# Patient Record
Sex: Female | Born: 1970 | Race: White | Hispanic: No | Marital: Married | Smoking: Former smoker
Health system: Southern US, Community
[De-identification: ages and names within clinical notes are randomized; demographics above are authoritative.]

## PROBLEM LIST (undated history)

## (undated) DIAGNOSIS — K219 Gastro-esophageal reflux disease without esophagitis: Secondary | ICD-10-CM

## (undated) DIAGNOSIS — E559 Vitamin D deficiency, unspecified: Secondary | ICD-10-CM

## (undated) DIAGNOSIS — K429 Umbilical hernia without obstruction or gangrene: Secondary | ICD-10-CM

## (undated) DIAGNOSIS — R011 Cardiac murmur, unspecified: Secondary | ICD-10-CM

## (undated) DIAGNOSIS — D649 Anemia, unspecified: Secondary | ICD-10-CM

## (undated) DIAGNOSIS — E890 Postprocedural hypothyroidism: Secondary | ICD-10-CM

## (undated) DIAGNOSIS — T7840XA Allergy, unspecified, initial encounter: Secondary | ICD-10-CM

## (undated) HISTORY — DX: Allergy, unspecified, initial encounter: T78.40XA

## (undated) HISTORY — PX: TUBAL LIGATION: SHX77

## (undated) HISTORY — DX: Cardiac murmur, unspecified: R01.1

## (undated) HISTORY — DX: Gastro-esophageal reflux disease without esophagitis: K21.9

## (undated) HISTORY — DX: Vitamin D deficiency, unspecified: E55.9

## (undated) HISTORY — PX: COLONOSCOPY: SHX174

## (undated) HISTORY — DX: Postprocedural hypothyroidism: E89.0

## (undated) HISTORY — DX: Umbilical hernia without obstruction or gangrene: K42.9

## (undated) HISTORY — DX: Anemia, unspecified: D64.9

---

## 2010-12-28 HISTORY — PX: APPENDECTOMY: SHX54

## 2010-12-28 HISTORY — PX: THYROIDECTOMY: SHX17

## 2010-12-28 HISTORY — PX: CHOLECYSTECTOMY: SHX55

## 2015-12-29 LAB — HM PAP SMEAR

## 2016-03-12 ENCOUNTER — Other Ambulatory Visit: Payer: Self-pay | Admitting: Family Medicine

## 2016-03-12 DIAGNOSIS — Z1231 Encounter for screening mammogram for malignant neoplasm of breast: Secondary | ICD-10-CM

## 2016-07-09 ENCOUNTER — Ambulatory Visit
Admission: RE | Admit: 2016-07-09 | Discharge: 2016-07-09 | Disposition: A | Discharge status: Home / Self Care | Payer: BLUE CROSS/BLUE SHIELD | Source: Ambulatory Visit | Attending: Family Medicine | Admitting: Family Medicine

## 2016-07-09 DIAGNOSIS — Z1231 Encounter for screening mammogram for malignant neoplasm of breast: Secondary | ICD-10-CM

## 2016-07-10 ENCOUNTER — Other Ambulatory Visit: Payer: Self-pay | Admitting: Family Medicine

## 2016-07-10 DIAGNOSIS — R928 Other abnormal and inconclusive findings on diagnostic imaging of breast: Secondary | ICD-10-CM

## 2016-07-15 ENCOUNTER — Ambulatory Visit
Admission: RE | Admit: 2016-07-15 | Discharge: 2016-07-15 | Disposition: A | Discharge status: Home / Self Care | Payer: BLUE CROSS/BLUE SHIELD | Source: Ambulatory Visit | Attending: Family Medicine | Admitting: Family Medicine

## 2016-07-15 DIAGNOSIS — N63 Unspecified lump in breast: Secondary | ICD-10-CM | POA: Diagnosis not present

## 2016-07-15 DIAGNOSIS — R928 Other abnormal and inconclusive findings on diagnostic imaging of breast: Secondary | ICD-10-CM

## 2016-08-06 DIAGNOSIS — J029 Acute pharyngitis, unspecified: Secondary | ICD-10-CM | POA: Diagnosis not present

## 2016-08-20 DIAGNOSIS — K429 Umbilical hernia without obstruction or gangrene: Secondary | ICD-10-CM | POA: Diagnosis not present

## 2016-08-20 DIAGNOSIS — J020 Streptococcal pharyngitis: Secondary | ICD-10-CM | POA: Diagnosis not present

## 2016-08-20 DIAGNOSIS — E663 Overweight: Secondary | ICD-10-CM | POA: Diagnosis not present

## 2016-08-20 DIAGNOSIS — J029 Acute pharyngitis, unspecified: Secondary | ICD-10-CM | POA: Diagnosis not present

## 2016-09-04 DIAGNOSIS — Z01419 Encounter for gynecological examination (general) (routine) without abnormal findings: Secondary | ICD-10-CM | POA: Diagnosis not present

## 2016-09-04 DIAGNOSIS — D259 Leiomyoma of uterus, unspecified: Secondary | ICD-10-CM | POA: Diagnosis not present

## 2016-09-04 DIAGNOSIS — R102 Pelvic and perineal pain: Secondary | ICD-10-CM | POA: Diagnosis not present

## 2016-09-10 DIAGNOSIS — R102 Pelvic and perineal pain: Secondary | ICD-10-CM | POA: Diagnosis not present

## 2016-09-10 DIAGNOSIS — D259 Leiomyoma of uterus, unspecified: Secondary | ICD-10-CM | POA: Diagnosis not present

## 2016-12-23 DIAGNOSIS — Z6827 Body mass index (BMI) 27.0-27.9, adult: Secondary | ICD-10-CM | POA: Diagnosis not present

## 2016-12-23 DIAGNOSIS — J019 Acute sinusitis, unspecified: Secondary | ICD-10-CM | POA: Diagnosis not present

## 2017-03-10 DIAGNOSIS — E890 Postprocedural hypothyroidism: Secondary | ICD-10-CM | POA: Diagnosis not present

## 2017-03-10 DIAGNOSIS — Z0000 Encounter for general adult medical examination without abnormal findings: Secondary | ICD-10-CM | POA: Diagnosis not present

## 2017-03-10 DIAGNOSIS — E559 Vitamin D deficiency, unspecified: Secondary | ICD-10-CM | POA: Diagnosis not present

## 2017-03-10 DIAGNOSIS — Z6827 Body mass index (BMI) 27.0-27.9, adult: Secondary | ICD-10-CM | POA: Diagnosis not present

## 2017-03-22 DIAGNOSIS — J0100 Acute maxillary sinusitis, unspecified: Secondary | ICD-10-CM | POA: Diagnosis not present

## 2017-04-22 DIAGNOSIS — Z1211 Encounter for screening for malignant neoplasm of colon: Secondary | ICD-10-CM | POA: Diagnosis not present

## 2017-04-22 DIAGNOSIS — Z01818 Encounter for other preprocedural examination: Secondary | ICD-10-CM | POA: Diagnosis not present

## 2017-04-22 DIAGNOSIS — Z800 Family history of malignant neoplasm of digestive organs: Secondary | ICD-10-CM | POA: Diagnosis not present

## 2017-06-02 DIAGNOSIS — Z800 Family history of malignant neoplasm of digestive organs: Secondary | ICD-10-CM | POA: Diagnosis not present

## 2017-06-02 DIAGNOSIS — E039 Hypothyroidism, unspecified: Secondary | ICD-10-CM | POA: Diagnosis not present

## 2017-06-02 DIAGNOSIS — Z1211 Encounter for screening for malignant neoplasm of colon: Secondary | ICD-10-CM | POA: Diagnosis not present

## 2017-06-02 DIAGNOSIS — K5730 Diverticulosis of large intestine without perforation or abscess without bleeding: Secondary | ICD-10-CM | POA: Diagnosis not present

## 2017-06-02 DIAGNOSIS — E559 Vitamin D deficiency, unspecified: Secondary | ICD-10-CM | POA: Diagnosis not present

## 2017-06-02 DIAGNOSIS — K648 Other hemorrhoids: Secondary | ICD-10-CM | POA: Diagnosis not present

## 2017-06-02 DIAGNOSIS — Z87891 Personal history of nicotine dependence: Secondary | ICD-10-CM | POA: Diagnosis not present

## 2017-06-02 DIAGNOSIS — K219 Gastro-esophageal reflux disease without esophagitis: Secondary | ICD-10-CM | POA: Diagnosis not present

## 2017-06-02 DIAGNOSIS — Z9049 Acquired absence of other specified parts of digestive tract: Secondary | ICD-10-CM | POA: Diagnosis not present

## 2017-06-02 LAB — HM COLONOSCOPY

## 2017-08-03 DIAGNOSIS — J029 Acute pharyngitis, unspecified: Secondary | ICD-10-CM | POA: Diagnosis not present

## 2017-08-03 DIAGNOSIS — J0100 Acute maxillary sinusitis, unspecified: Secondary | ICD-10-CM | POA: Diagnosis not present

## 2017-09-30 ENCOUNTER — Other Ambulatory Visit: Payer: Self-pay | Admitting: Medical

## 2017-09-30 DIAGNOSIS — K219 Gastro-esophageal reflux disease without esophagitis: Secondary | ICD-10-CM | POA: Diagnosis not present

## 2017-09-30 DIAGNOSIS — Z1231 Encounter for screening mammogram for malignant neoplasm of breast: Secondary | ICD-10-CM

## 2017-09-30 DIAGNOSIS — Z23 Encounter for immunization: Secondary | ICD-10-CM | POA: Diagnosis not present

## 2017-09-30 DIAGNOSIS — E034 Atrophy of thyroid (acquired): Secondary | ICD-10-CM | POA: Diagnosis not present

## 2017-09-30 DIAGNOSIS — E890 Postprocedural hypothyroidism: Secondary | ICD-10-CM | POA: Diagnosis not present

## 2017-10-13 ENCOUNTER — Ambulatory Visit
Admission: RE | Admit: 2017-10-13 | Discharge: 2017-10-13 | Disposition: A | Discharge status: Home / Self Care | Payer: BLUE CROSS/BLUE SHIELD | Source: Ambulatory Visit | Attending: Medical | Admitting: Medical

## 2017-10-13 DIAGNOSIS — Z1231 Encounter for screening mammogram for malignant neoplasm of breast: Secondary | ICD-10-CM

## 2018-03-31 DIAGNOSIS — Z0000 Encounter for general adult medical examination without abnormal findings: Secondary | ICD-10-CM | POA: Diagnosis not present

## 2018-03-31 DIAGNOSIS — Z6827 Body mass index (BMI) 27.0-27.9, adult: Secondary | ICD-10-CM | POA: Diagnosis not present

## 2018-03-31 DIAGNOSIS — J018 Other acute sinusitis: Secondary | ICD-10-CM | POA: Diagnosis not present

## 2018-09-30 DIAGNOSIS — Z23 Encounter for immunization: Secondary | ICD-10-CM | POA: Diagnosis not present

## 2018-09-30 DIAGNOSIS — E890 Postprocedural hypothyroidism: Secondary | ICD-10-CM | POA: Diagnosis not present

## 2018-09-30 DIAGNOSIS — K219 Gastro-esophageal reflux disease without esophagitis: Secondary | ICD-10-CM | POA: Diagnosis not present

## 2018-09-30 DIAGNOSIS — E559 Vitamin D deficiency, unspecified: Secondary | ICD-10-CM | POA: Diagnosis not present

## 2018-09-30 DIAGNOSIS — F5102 Adjustment insomnia: Secondary | ICD-10-CM | POA: Diagnosis not present

## 2019-01-31 ENCOUNTER — Other Ambulatory Visit: Payer: Self-pay | Admitting: Medical

## 2019-01-31 DIAGNOSIS — Z1231 Encounter for screening mammogram for malignant neoplasm of breast: Secondary | ICD-10-CM

## 2019-03-01 ENCOUNTER — Ambulatory Visit
Admission: RE | Admit: 2019-03-01 | Discharge: 2019-03-01 | Disposition: A | Discharge status: Home / Self Care | Payer: BLUE CROSS/BLUE SHIELD | Source: Ambulatory Visit | Attending: Medical | Admitting: Medical

## 2019-03-01 DIAGNOSIS — Z1231 Encounter for screening mammogram for malignant neoplasm of breast: Secondary | ICD-10-CM

## 2019-03-01 LAB — HM MAMMOGRAPHY: HM Mammogram: NORMAL (ref 0–4)

## 2019-05-25 DIAGNOSIS — Z6823 Body mass index (BMI) 23.0-23.9, adult: Secondary | ICD-10-CM | POA: Diagnosis not present

## 2019-05-25 DIAGNOSIS — Z0000 Encounter for general adult medical examination without abnormal findings: Secondary | ICD-10-CM | POA: Diagnosis not present

## 2019-07-27 DIAGNOSIS — E890 Postprocedural hypothyroidism: Secondary | ICD-10-CM | POA: Diagnosis not present

## 2019-08-01 DIAGNOSIS — J301 Allergic rhinitis due to pollen: Secondary | ICD-10-CM | POA: Diagnosis not present

## 2020-01-04 DIAGNOSIS — J018 Other acute sinusitis: Secondary | ICD-10-CM | POA: Diagnosis not present

## 2020-01-23 DIAGNOSIS — Z20828 Contact with and (suspected) exposure to other viral communicable diseases: Secondary | ICD-10-CM | POA: Diagnosis not present

## 2020-01-23 DIAGNOSIS — R0981 Nasal congestion: Secondary | ICD-10-CM | POA: Diagnosis not present

## 2020-02-07 ENCOUNTER — Other Ambulatory Visit: Payer: Self-pay

## 2020-02-07 ENCOUNTER — Encounter: Payer: Self-pay | Admitting: Family Medicine

## 2020-02-07 ENCOUNTER — Ambulatory Visit (INDEPENDENT_AMBULATORY_CARE_PROVIDER_SITE_OTHER): Payer: BLUE CROSS/BLUE SHIELD | Admitting: Family Medicine

## 2020-02-07 VITALS — BP 136/88 | HR 111 | Temp 96.7°F | Ht 65.0 in | Wt 168.8 lb

## 2020-02-07 DIAGNOSIS — K439 Ventral hernia without obstruction or gangrene: Secondary | ICD-10-CM

## 2020-02-07 DIAGNOSIS — E038 Other specified hypothyroidism: Secondary | ICD-10-CM | POA: Diagnosis not present

## 2020-02-07 DIAGNOSIS — E559 Vitamin D deficiency, unspecified: Secondary | ICD-10-CM

## 2020-02-07 DIAGNOSIS — Z1231 Encounter for screening mammogram for malignant neoplasm of breast: Secondary | ICD-10-CM | POA: Diagnosis not present

## 2020-02-07 DIAGNOSIS — Z0000 Encounter for general adult medical examination without abnormal findings: Secondary | ICD-10-CM

## 2020-02-07 NOTE — Patient Instructions (Signed)
Health Maintenance, Female Adopting a healthy lifestyle and getting preventive care are important in promoting health and wellness. Ask your health care provider about:  The right schedule for you to have regular tests and exams.  Things you can do on your own to prevent diseases and keep yourself healthy. What should I know about diet, weight, and exercise? Eat a healthy diet   Eat a diet that includes plenty of vegetables, fruits, low-fat dairy products, and lean protein.  Do not eat a lot of foods that are high in solid fats, added sugars, or sodium. Maintain a healthy weight Body mass index (BMI) is used to identify weight problems. It estimates body fat based on height and weight. Your health care provider can help determine your BMI and help you achieve or maintain a healthy weight. Get regular exercise Get regular exercise. This is one of the most important things you can do for your health. Most adults should:  Exercise for at least 150 minutes each week. The exercise should increase your heart rate and make you sweat (moderate-intensity exercise).  Do strengthening exercises at least twice a week. This is in addition to the moderate-intensity exercise.  Spend less time sitting. Even light physical activity can be beneficial. Watch cholesterol and blood lipids Have your blood tested for lipids and cholesterol at 49 years of age, then have this test every 5 years. Have your cholesterol levels checked more often if:  Your lipid or cholesterol levels are high.  You are older than 49 years of age.  You are at high risk for heart disease. What should I know about cancer screening? Depending on your health history and family history, you may need to have cancer screening at various ages. This may include screening for:  Breast cancer.  Cervical cancer.  Colorectal cancer.  Skin cancer.  Lung cancer. What should I know about heart disease, diabetes, and high blood  pressure? Blood pressure and heart disease  High blood pressure causes heart disease and increases the risk of stroke. This is more likely to develop in people who have high blood pressure readings, are of African descent, or are overweight.  Have your blood pressure checked: ? Every 3-5 years if you are 18-39 years of age. ? Every year if you are 40 years old or older. Diabetes Have regular diabetes screenings. This checks your fasting blood sugar level. Have the screening done:  Once every three years after age 40 if you are at a normal weight and have a low risk for diabetes.  More often and at a younger age if you are overweight or have a high risk for diabetes. What should I know about preventing infection? Hepatitis B If you have a higher risk for hepatitis B, you should be screened for this virus. Talk with your health care provider to find out if you are at risk for hepatitis B infection. Hepatitis C Testing is recommended for:  Everyone born from 1945 through 1965.  Anyone with known risk factors for hepatitis C. Sexually transmitted infections (STIs)  Get screened for STIs, including gonorrhea and chlamydia, if: ? You are sexually active and are younger than 49 years of age. ? You are older than 49 years of age and your health care provider tells you that you are at risk for this type of infection. ? Your sexual activity has changed since you were last screened, and you are at increased risk for chlamydia or gonorrhea. Ask your health care provider if   you are at risk.  Ask your health care provider about whether you are at high risk for HIV. Your health care provider may recommend a prescription medicine to help prevent HIV infection. If you choose to take medicine to prevent HIV, you should first get tested for HIV. You should then be tested every 3 months for as long as you are taking the medicine. Pregnancy  If you are about to stop having your period (premenopausal) and  you may become pregnant, seek counseling before you get pregnant.  Take 400 to 800 micrograms (mcg) of folic acid every day if you become pregnant.  Ask for birth control (contraception) if you want to prevent pregnancy. Osteoporosis and menopause Osteoporosis is a disease in which the bones lose minerals and strength with aging. This can result in bone fractures. If you are 65 years old or older, or if you are at risk for osteoporosis and fractures, ask your health care provider if you should:  Be screened for bone loss.  Take a calcium or vitamin D supplement to lower your risk of fractures.  Be given hormone replacement therapy (HRT) to treat symptoms of menopause. Follow these instructions at home: Lifestyle  Do not use any products that contain nicotine or tobacco, such as cigarettes, e-cigarettes, and chewing tobacco. If you need help quitting, ask your health care provider.  Do not use street drugs.  Do not share needles.  Ask your health care provider for help if you need support or information about quitting drugs. Alcohol use  Do not drink alcohol if: ? Your health care provider tells you not to drink. ? You are pregnant, may be pregnant, or are planning to become pregnant.  If you drink alcohol: ? Limit how much you use to 0-1 drink a day. ? Limit intake if you are breastfeeding.  Be aware of how much alcohol is in your drink. In the [place] one drink equals one 12 oz bottle of beer (355 mL), one 5 oz glass of wine (148 mL), or one 1 oz glass of hard liquor (44 mL). General instructions  Schedule regular health, dental, and eye exams.  Stay current with your vaccines.  Tell your health care provider if: ? You often feel depressed. ? You have ever been abused or do not feel safe at home. Summary  Adopting a healthy lifestyle and getting preventive care are important in promoting health and wellness.  Follow your health care provider's instructions about healthy  diet, exercising, and getting tested or screened for diseases.  Follow your health care provider's instructions on monitoring your cholesterol and blood pressure. This information is not intended to replace advice given to you by your health care provider. Make sure you discuss any questions you have with your health care provider. Document Revised: 12/07/2018 Document Reviewed: 12/07/2018 Elsevier Patient Education  2020 Elsevier Inc.  

## 2020-02-08 LAB — COMP. METABOLIC PANEL (12)
AST: 25 IU/L (ref 0–40)
Albumin/Globulin Ratio: 2 (ref 1.2–2.2)
Albumin: 4.5 g/dL (ref 3.8–4.8)
Alkaline Phosphatase: 66 IU/L (ref 39–117)
BUN/Creatinine Ratio: 12 (ref 9–23)
BUN: 10 mg/dL (ref 6–24)
Bilirubin Total: 0.4 mg/dL (ref 0.0–1.2)
Calcium: 9.4 mg/dL (ref 8.7–10.2)
Chloride: 103 mmol/L (ref 96–106)
Creatinine, Ser: 0.81 mg/dL (ref 0.57–1.00)
GFR calc Af Amer: 99 mL/min/{1.73_m2} (ref >59)
GFR calc non Af Amer: 86 mL/min/{1.73_m2} (ref >59)
Globulin, Total: 2.3 g/dL (ref 1.5–4.5)
Glucose: 93 mg/dL (ref 65–99)
Potassium: 4.1 mmol/L (ref 3.5–5.2)
Sodium: 140 mmol/L (ref 134–144)
Total Protein: 6.8 g/dL (ref 6.0–8.5)

## 2020-02-08 LAB — LIPID PANEL
Chol/HDL Ratio: 3.3 ratio (ref 0.0–4.4)
Cholesterol, Total: 176 mg/dL (ref 100–199)
HDL: 53 mg/dL (ref >39)
LDL Chol Calc (NIH): 98 mg/dL (ref 0–99)
Triglycerides: 142 mg/dL (ref 0–149)
VLDL Cholesterol Cal: 25 mg/dL (ref 5–40)

## 2020-02-08 LAB — CBC WITH DIFFERENTIAL/PLATELET
Basophils Absolute: 0 10*3/uL (ref 0.0–0.2)
Basos: 1 %
EOS (ABSOLUTE): 0 10*3/uL (ref 0.0–0.4)
Eos: 0 %
Hematocrit: 40.3 % (ref 34.0–46.6)
Hemoglobin: 13.3 g/dL (ref 11.1–15.9)
Immature Grans (Abs): 0 10*3/uL (ref 0.0–0.1)
Immature Granulocytes: 0 %
Lymphocytes Absolute: 1.7 10*3/uL (ref 0.7–3.1)
Lymphs: 23 %
MCH: 28.6 pg (ref 26.6–33.0)
MCHC: 33 g/dL (ref 31.5–35.7)
MCV: 87 fL (ref 79–97)
Monocytes Absolute: 0.7 10*3/uL (ref 0.1–0.9)
Monocytes: 9 %
Neutrophils Absolute: 5.1 10*3/uL (ref 1.4–7.0)
Neutrophils: 67 %
Platelets: 357 10*3/uL (ref 150–450)
RBC: 4.65 x10E6/uL (ref 3.77–5.28)
RDW: 13.7 % (ref 11.7–15.4)
WBC: 7.6 10*3/uL (ref 3.4–10.8)

## 2020-02-08 LAB — TSH: TSH: 0.31 u[IU]/mL — ABNORMAL LOW (ref 0.450–4.500)

## 2020-02-08 LAB — CARDIOVASCULAR RISK ASSESSMENT

## 2020-02-08 LAB — VITAMIN D 25 HYDROXY (VIT D DEFICIENCY, FRACTURES): Vit D, 25-Hydroxy: 43.9 ng/mL (ref 30.0–100.0)

## 2020-02-09 ENCOUNTER — Other Ambulatory Visit: Payer: Self-pay

## 2020-02-09 DIAGNOSIS — E038 Other specified hypothyroidism: Secondary | ICD-10-CM

## 2020-02-12 ENCOUNTER — Other Ambulatory Visit: Payer: Self-pay

## 2020-02-12 MED ORDER — FUROSEMIDE 20 MG PO TABS: 20.0000 mg | ORAL_TABLET | Freq: Every day | ORAL | 2 refills | Status: DC | PRN

## 2020-02-12 MED ORDER — THYROID 90 MG PO TABS: 90.0000 mg | ORAL_TABLET | Freq: Every day | ORAL | 0 refills | Status: DC

## 2020-02-14 ENCOUNTER — Other Ambulatory Visit: Payer: Self-pay | Admitting: Family Medicine

## 2020-02-15 ENCOUNTER — Other Ambulatory Visit: Payer: Self-pay

## 2020-02-15 MED ORDER — THYROID 90 MG PO TABS: 90.0000 mg | ORAL_TABLET | Freq: Every day | ORAL | 0 refills | Status: DC

## 2020-02-19 ENCOUNTER — Encounter: Payer: Self-pay | Admitting: Family Medicine

## 2020-02-19 NOTE — Progress Notes (Signed)
Subjective:  Patient ID: Sherry Carpenter, female    DOB: Apr 28, 1971  Age: 49 y.o. MRN: [id number]  Chief Complaint  Patient presents with  . Annual Exam  . Gastroesophageal Reflux  . Hypothyroidism    HPI Encounter for general adult medical examination without abnormal findings  Physical ("At Risk" items are starred): Patient's last physical exam was 1 year ago .  Weight: Appropriate for height (BMI less than 27%) ;  Blood Pressure: Normal (BP less than 120/80) ;  Medical History: Patient history reviewed ; Family history reviewed ;  Allergies Reviewed: No change in current allergies ;  Medications Reviewed: Medications reviewed - no changes ;  Lipids: Normal lipid levels ;  Smoking: Life-long non-smoker ;  Physical Activity: Does not exercise ;  Alcohol/Drug Use: Is a non-drinker ; No illicit drug use ;  Patient is not afflicted from Stress Incontinence and Urge Incontinence  Safety: reviewed ; Patient wears a seat belt, has smoke detectors, has carbon monoxide detectors, practices appropriate gun safety, and wears sunscreen with extended sun exposure. Dental Care: biannual cleanings, brushes and flosses daily. Ophthalmology/Optometry: Annual visit.  Hearing loss: none Vision impairments: none Last Mammogram: 03/01/2019. Colonoscopy: 06/02/2017. Pap Smear: 2017 done by gynecologist.   Fall Risk  02/07/2020  Falls in the past year? 0  Number falls in past yr: 0  Injury with Fall? 0     Depression screen PHQ 2/9 02/07/2020  Decreased Interest 0  Down, Depressed, Hopeless 0  PHQ - 2 Score 0     Functional Status Survey: Is the patient deaf or have difficulty hearing?: No Does the patient have difficulty seeing, even when wearing glasses/contacts?: No Does the patient have difficulty concentrating, remembering, or making decisions?: No Does the patient have difficulty walking or climbing stairs?: No Does the patient have difficulty dressing or bathing?: No Does the patient  have difficulty doing errands alone such as visiting a doctor's office or shopping?: No   Social Hx   Social History   Socioeconomic History  . Marital status: Married    Spouse name: Not on file  . Number of children: 2  . Years of education: Not on file  . Highest education level: Not on file  Occupational History  . Occupation: Theatre stage manager. Assistant  Tobacco Use  . Smoking status: Former Smoker    Types: Cigarettes    Quit date: 12/28/2010    Years since quitting: 9.1  . Smokeless tobacco: Never Used  Substance and Sexual Activity  . Alcohol use: Not Currently  . Drug use: Never  . Sexual activity: Not on file  Other Topics Concern  . Not on file  Social History Narrative   wears sunscreen, brushes and flosses daily, see's dentist bi-annually, has smoke detectors, wears a seatbelt and practices gun safety   Social Determinants of Health   Financial Resource Strain:   . Difficulty of Paying Living Expenses: Not on file  Food Insecurity:   . Worried About Programme researcher, broadcasting/film/video in the Last Year: Not on file  . Ran Out of Food in the Last Year: Not on file  Transportation Needs:   . Lack of Transportation (Medical): Not on file  . Lack of Transportation (Non-Medical): Not on file  Physical Activity:   . Days of Exercise per Week: Not on file  . Minutes of Exercise per Session: Not on file  Stress:   . Feeling of Stress : Not on file  Social Connections:   .  Frequency of Communication with Friends and Family: Not on file  . Frequency of Social Gatherings with Friends and Family: Not on file  . Attends Religious Services: Not on file  . Active Member of Clubs or Organizations: Not on file  . Attends Banker Meetings: Not on file  . Marital Status: Not on file   Past Medical History:  Diagnosis Date  . GERD (gastroesophageal reflux disease)   . Postprocedural hypothyroidism   . Umbilical hernia   . Vitamin D deficiency, unspecified    Family History   Problem Relation Age of Onset  . Colon cancer Mother   . Lung cancer Mother   . Hypothyroidism Mother   . Breast cancer Neg Hx     Review of Systems  Constitutional: Positive for fatigue. Negative for chills and fever.  HENT: Positive for congestion. Negative for ear pain and sore throat.   Eyes: Negative for pain.  Respiratory: Negative for cough and shortness of breath.   Cardiovascular: Negative for chest pain.  Gastrointestinal: Negative for abdominal pain, constipation, diarrhea, nausea and vomiting.  Endocrine: Negative for polydipsia, polyphagia and polyuria.  Genitourinary: Positive for menstrual problem. Negative for dysuria and urgency.       Cramps. Known fibroids.    Musculoskeletal: Negative for arthralgias and myalgias.  Neurological: Negative for dizziness and headaches.  Psychiatric/Behavioral: Negative for dysphoric mood. The patient is not nervous/anxious.        Grieving death of coworker.      Objective:  BP 136/88 (BP Location: Right Arm, Patient Position: Sitting)   Pulse (!) 111   Temp (!) 96.7 F (35.9 C) (Temporal)   Ht 5\' 5"  (1.651 m)   Wt 168 lb 12.8 oz (76.6 kg)   LMP 01/24/2020 (Exact Date)   SpO2 97%   BMI 28.09 kg/m   BP/Weight 02/07/2020  Systolic BP 136  Diastolic BP 88  Wt. (Lbs) 168.8  BMI 28.09    Physical Exam Vitals reviewed.  Constitutional:      General: She is not in acute distress.    Appearance: Normal appearance. She is obese.  HENT:     Right Ear: Tympanic membrane and ear canal normal.     Left Ear: Tympanic membrane and ear canal normal.     Nose: Nose normal. No congestion or rhinorrhea.  Eyes:     Conjunctiva/sclera: Conjunctivae normal.  Neck:     Thyroid: No thyroid mass.  Cardiovascular:     Rate and Rhythm: Normal rate and regular rhythm.     Pulses: Normal pulses.     Heart sounds: No murmur.  Pulmonary:     Effort: Pulmonary effort is normal.     Breath sounds: Normal breath sounds.  Abdominal:      General: Bowel sounds are normal.     Palpations: Abdomen is soft. There is no mass.     Tenderness: There is no abdominal tenderness.     Comments: Ventral hernia.   Musculoskeletal:        General: Normal range of motion.  Lymphadenopathy:     Cervical: No cervical adenopathy.  Skin:    General: Skin is warm and dry.  Neurological:     Mental Status: She is alert and oriented to person, place, and time.     Cranial Nerves: No cranial nerve deficit.  Psychiatric:        Mood and Affect: Mood normal.        Behavior: Behavior normal.  Lab Results  Component Value Date   WBC 7.6 02/07/2020   HGB 13.3 02/07/2020   HCT 40.3 02/07/2020   PLT 357 02/07/2020   GLUCOSE 93 02/07/2020   CHOL 176 02/07/2020   TRIG 142 02/07/2020   HDL 53 02/07/2020   LDLCALC 98 02/07/2020   AST 25 02/07/2020   NA 140 02/07/2020   K 4.1 02/07/2020   CL 103 02/07/2020   CREATININE 0.81 02/07/2020   BUN 10 02/07/2020   TSH 0.310 (L) 02/07/2020      Assessment & Plan:   Problem List Items Addressed This Visit      Endocrine   Secondary hypothyroidism     Other   Visit for screening mammogram - Primary   Relevant Orders   MM Digital Screening   Ventral hernia without obstruction or gangrene   Vitamin D insufficiency   Relevant Orders   Vitamin D, 25-hydroxy (Completed)      No problem-specific Assessment & Plan notes found for this encounter.   No orders of the defined types were placed in this encounter.   AN INDIVIDUALIZED CARE PLAN: was established or reinforced today.   The patient's disease status was assessed using clinical findings on exam, labs, and/or other diagnostic testing, such as xrays, to determine his/her success in meeting treatment goals based on disease specific evidence-based guidelines and found to be well controlled.   SELF MANAGEMENT: The patient and I together assessed ways to personally work towards obtaining the recommended goals  Support needs The  patient and/or family needs were assessed and services were offered and not necessary at this time.    Follow-up: Return in about 6 months (around 08/06/2020) for fasting.  A CLINICAL SUMMARY including a written plan identify barriers to care unique to individual due to social or financial issues and help create solutions together. and a patient's and the patient's families understanding of their medical issues and care needs   [place] [phone]

## 2020-03-22 ENCOUNTER — Other Ambulatory Visit: Payer: Self-pay

## 2020-03-22 ENCOUNTER — Other Ambulatory Visit: Payer: Self-pay | Admitting: Family Medicine

## 2020-03-22 ENCOUNTER — Other Ambulatory Visit: Payer: BLUE CROSS/BLUE SHIELD

## 2020-03-22 DIAGNOSIS — E038 Other specified hypothyroidism: Secondary | ICD-10-CM | POA: Diagnosis not present

## 2020-03-22 DIAGNOSIS — Z1231 Encounter for screening mammogram for malignant neoplasm of breast: Secondary | ICD-10-CM

## 2020-03-23 LAB — TSH: TSH: 4.93 u[IU]/mL — ABNORMAL HIGH (ref 0.450–4.500)

## 2020-03-26 ENCOUNTER — Other Ambulatory Visit: Payer: Self-pay | Admitting: Legal Medicine

## 2020-03-27 ENCOUNTER — Other Ambulatory Visit: Payer: Self-pay

## 2020-03-27 DIAGNOSIS — E038 Other specified hypothyroidism: Secondary | ICD-10-CM

## 2020-03-27 LAB — T4, FREE: Free T4: 0.72 ng/dL — ABNORMAL LOW (ref 0.82–1.77)

## 2020-03-27 LAB — SPECIMEN STATUS REPORT

## 2020-03-27 NOTE — Progress Notes (Signed)
Add on order. 

## 2020-03-28 ENCOUNTER — Other Ambulatory Visit: Payer: Self-pay | Admitting: Physician Assistant

## 2020-03-28 DIAGNOSIS — E038 Other specified hypothyroidism: Secondary | ICD-10-CM

## 2020-03-28 MED ORDER — THYROID 120 MG PO TABS: 120.0000 mg | ORAL_TABLET | Freq: Every day | ORAL | 2 refills | Status: DC

## 2020-04-11 ENCOUNTER — Other Ambulatory Visit: Payer: Self-pay

## 2020-04-11 ENCOUNTER — Ambulatory Visit
Admission: RE | Admit: 2020-04-11 | Discharge: 2020-04-11 | Disposition: A | Discharge status: Home / Self Care | Payer: BLUE CROSS/BLUE SHIELD | Source: Ambulatory Visit | Attending: Family Medicine | Admitting: Family Medicine

## 2020-04-11 DIAGNOSIS — Z1231 Encounter for screening mammogram for malignant neoplasm of breast: Secondary | ICD-10-CM

## 2020-04-12 ENCOUNTER — Other Ambulatory Visit: Payer: Self-pay | Admitting: Family Medicine

## 2020-04-12 DIAGNOSIS — R928 Other abnormal and inconclusive findings on diagnostic imaging of breast: Secondary | ICD-10-CM

## 2020-04-16 ENCOUNTER — Other Ambulatory Visit: Payer: Self-pay

## 2020-04-16 DIAGNOSIS — R928 Other abnormal and inconclusive findings on diagnostic imaging of breast: Secondary | ICD-10-CM

## 2020-04-26 ENCOUNTER — Other Ambulatory Visit: Payer: Self-pay

## 2020-04-26 ENCOUNTER — Ambulatory Visit
Admission: RE | Admit: 2020-04-26 | Discharge: 2020-04-26 | Disposition: A | Discharge status: Home / Self Care | Payer: BLUE CROSS/BLUE SHIELD | Source: Ambulatory Visit | Attending: Family Medicine | Admitting: Family Medicine

## 2020-04-26 DIAGNOSIS — R922 Inconclusive mammogram: Secondary | ICD-10-CM | POA: Diagnosis not present

## 2020-04-26 DIAGNOSIS — R928 Other abnormal and inconclusive findings on diagnostic imaging of breast: Secondary | ICD-10-CM

## 2020-04-26 DIAGNOSIS — N6011 Diffuse cystic mastopathy of right breast: Secondary | ICD-10-CM | POA: Diagnosis not present

## 2020-05-17 DIAGNOSIS — J0100 Acute maxillary sinusitis, unspecified: Secondary | ICD-10-CM | POA: Diagnosis not present

## 2020-05-29 ENCOUNTER — Ambulatory Visit: Payer: Self-pay

## 2020-07-23 ENCOUNTER — Other Ambulatory Visit: Payer: Self-pay | Admitting: Family Medicine

## 2020-08-07 ENCOUNTER — Ambulatory Visit: Payer: BLUE CROSS/BLUE SHIELD | Admitting: Family Medicine

## 2020-08-14 ENCOUNTER — Encounter: Payer: Self-pay | Admitting: Family Medicine

## 2020-08-14 ENCOUNTER — Other Ambulatory Visit: Payer: Self-pay

## 2020-08-14 ENCOUNTER — Ambulatory Visit (INDEPENDENT_AMBULATORY_CARE_PROVIDER_SITE_OTHER): Payer: BLUE CROSS/BLUE SHIELD | Admitting: Family Medicine

## 2020-08-14 VITALS — BP 110/70 | HR 72 | Temp 97.3°F | Resp 14 | Ht 65.0 in | Wt 157.6 lb

## 2020-08-14 DIAGNOSIS — N920 Excessive and frequent menstruation with regular cycle: Secondary | ICD-10-CM

## 2020-08-14 DIAGNOSIS — M542 Cervicalgia: Secondary | ICD-10-CM | POA: Diagnosis not present

## 2020-08-14 DIAGNOSIS — J0181 Other acute recurrent sinusitis: Secondary | ICD-10-CM | POA: Diagnosis not present

## 2020-08-14 DIAGNOSIS — E038 Other specified hypothyroidism: Secondary | ICD-10-CM | POA: Diagnosis not present

## 2020-08-14 MED ORDER — AMOXICILLIN 875 MG PO TABS: 875.0000 mg | ORAL_TABLET | Freq: Two times a day (BID) | ORAL | 0 refills | Status: DC

## 2020-08-14 NOTE — Patient Instructions (Signed)

## 2020-08-14 NOTE — Progress Notes (Signed)
Subjective:  Patient ID: Sherry Carpenter, female    DOB: 1971/05/23  Age: 49 y.o. MRN: [id number]  Chief Complaint  Patient presents with   Hypothyroidism    HPI  Patient is following up on hypothyroidism.  Takes [place] Thyroid 120 daily.  Patient was treated for sinusitis in May at the urgent care.  She has not felt like she is fully gotten better.  She feels some crackling in her ear and some nasal congestion.  No true sinus pain.  Patient states that she has not felt the same since she had Covid in January February 2021.  She fuses to get the Covid vaccine because she feels like she is natural immunity.  Also refuses a flu shot.  Patient has had increasingly heavy menses.  Her periods last for 5 days and are once a month however.  She also has had some increasing cramps so she is taking Aleve for this has increased over the last year.  She is due to go back to see her gynecologist.  Her last Pap smear was 2015.  Patient is also complaining of some left-sided neck pain.  Sometimes she feels like her the left side of her neck is swollen.  This is been going on for about 5 months and waxes and wanes as far as the swelling. Current Outpatient Medications on File Prior to Visit  Medication Sig Dispense Refill   diphenhydrAMINE (BENADRYL) 25 mg capsule Take 50 mg by mouth at bedtime as needed.     fexofenadine-pseudoephedrine (ALLEGRA-D) 60-120 MG 12 hr tablet Take 1 tablet by mouth daily.     fluticasone (FLONASE) 50 MCG/ACT nasal spray USE 1 SPRAY IN EACH NOSTRIL ONCE DAILY 48 g 3   furosemide (LASIX) 20 MG tablet TAKE 1 TABLET BY MOUTH ONCE DAILY AS NEEDED FOR SWELLING 30 tablet 2   pantoprazole (PROTONIX) 40 MG tablet TAKE ONE TABLET BY MOUTH EVERY DAY 90 tablet 3   thyroid (ARMOUR THYROID) 120 MG tablet Take 1 tablet (120 mg total) by mouth daily before breakfast. 30 tablet 2   Vitamin D, Ergocalciferol, (DRISDOL) 1.25 MG (50000 UNIT) CAPS capsule TAKE 1 CAPSULE ONCE WEEKLY 12  capsule 3   No current facility-administered medications on file prior to visit.   Past Medical History:  Diagnosis Date   GERD (gastroesophageal reflux disease)    Postprocedural hypothyroidism    Umbilical hernia    Vitamin D deficiency, unspecified    Past Surgical History:  Procedure Laterality Date   APPENDECTOMY  2012   CHOLECYSTECTOMY  2012   THYROIDECTOMY  2012    Family History  Problem Relation Age of Onset   Colon cancer Mother    Lung cancer Mother    Hypothyroidism Mother    Breast cancer Neg Hx    Social History   Socioeconomic History   Marital status: Married    Spouse name: Not on file   Number of children: 2   Years of education: Not on file   Highest education level: Not on file  Occupational History   Occupation: Theatre stage manager. Assistant  Tobacco Use   Smoking status: Former Smoker    Types: Cigarettes    Quit date: 12/28/2010    Years since quitting: 9.6   Smokeless tobacco: Never Used  Vaping Use   Vaping Use: Never used  Substance and Sexual Activity   Alcohol use: Not Currently   Drug use: Never   Sexual activity: Not on file  Other Topics Concern  Not on file  Social History Narrative   wears sunscreen, brushes and flosses daily, see's dentist bi-annually, has smoke detectors, wears a seatbelt and practices gun safety   Social Determinants of Health   Financial Resource Strain:    Difficulty of Paying Living Expenses:   Food Insecurity:    Worried About Programme researcher, broadcasting/film/video in the Last Year:    Barista in the Last Year:   Transportation Needs:    Freight forwarder (Medical):    Lack of Transportation (Non-Medical):   Physical Activity:    Days of Exercise per Week:    Minutes of Exercise per Session:   Stress:    Feeling of Stress :   Social Connections:    Frequency of Communication with Friends and Family:    Frequency of Social Gatherings with Friends and Family:    Attends  Religious Services:    Active Member of Clubs or Organizations:    Attends Banker Meetings:    Marital Status:     Review of Systems  Constitutional: Negative for chills, fatigue and fever.  HENT: Positive for congestion. Negative for rhinorrhea and sore throat.   Respiratory: Negative for cough and shortness of breath.   Cardiovascular: Negative for chest pain and palpitations.  Gastrointestinal: Negative for abdominal pain, constipation, diarrhea, nausea and vomiting.  Genitourinary: Negative for dysuria and urgency.  Musculoskeletal: Positive for neck pain. Negative for myalgias.  Neurological: Negative for dizziness, weakness, light-headedness and headaches.  Psychiatric/Behavioral: Negative for dysphoric mood. The patient is not nervous/anxious.      Objective:  BP 110/70    Pulse 72    Temp (!) 97.3 F (36.3 C)    Resp 14    Ht 5\' 5"  (1.651 m)    Wt 157 lb 9.6 oz (71.5 kg)    BMI 26.23 kg/m   BP/Weight 08/14/2020 02/07/2020  Systolic BP 110 136  Diastolic BP 70 88  Wt. (Lbs) 157.6 168.8  BMI 26.23 28.09    Physical Exam Vitals reviewed.  Constitutional:      Appearance: Normal appearance. She is normal weight.  HENT:     Right Ear: Tympanic membrane, ear canal and external ear normal.     Left Ear: Tympanic membrane and external ear normal.     Nose: Congestion present.     Comments: Erythematous turbinates.    Mouth/Throat:     Pharynx: Oropharynx is clear.  Neck:     Vascular: No carotid bruit.  Cardiovascular:     Rate and Rhythm: Normal rate and regular rhythm.     Heart sounds: Normal heart sounds. No murmur heard.   Pulmonary:     Effort: Pulmonary effort is normal. No respiratory distress.     Breath sounds: Normal breath sounds.  Musculoskeletal:     Cervical back: Tenderness (Left side posteriorly.  Rest of neck exam was normal.) present. No rigidity.  Lymphadenopathy:     Cervical: No cervical adenopathy.  Neurological:     Mental  Status: She is alert and oriented to person, place, and time.  Psychiatric:        Mood and Affect: Mood normal.        Behavior: Behavior normal.       Lab Results  Component Value Date   WBC 7.6 02/07/2020   HGB 13.3 02/07/2020   HCT 40.3 02/07/2020   PLT 357 02/07/2020   GLUCOSE 93 02/07/2020   CHOL 176 02/07/2020   TRIG  142 02/07/2020   HDL 53 02/07/2020   LDLCALC 98 02/07/2020   AST 25 02/07/2020   NA 140 02/07/2020   K 4.1 02/07/2020   CL 103 02/07/2020   CREATININE 0.81 02/07/2020   BUN 10 02/07/2020   TSH 4.930 (H) 03/22/2020      Assessment & Plan:   1. Menorrhagia with regular cycle Patient to get an appointment with her gynecologist or return for a physical.  Per patient history she is due for a Pap smear.  She is up-to-date on her mammograms.  We will check her blood counts that she is having heavier menses.  Per the patient she has a history of a fibroid - CBC with Differential/Platelet  2. Secondary hypothyroidism - TSH  3. Other acute recurrent sinusitis - amoxicillin (AMOXIL) 875 MG tablet; Take 1 tablet (875 mg total) by mouth 2 (two) times daily.  Dispense: 20 tablet; Refill: 0  4. Neck pain Neck exercises given.  Patient prefers no medication at this time.  Meds ordered this encounter  Medications   amoxicillin (AMOXIL) 875 MG tablet    Sig: Take 1 tablet (875 mg total) by mouth 2 (two) times daily.    Dispense:  20 tablet    Refill:  0    Orders Placed This Encounter  Procedures   CBC with Differential/Platelet   TSH     Follow-up: No follow-ups on file.  An After Visit Summary was printed and given to the patient.  Blane Ohara [facility] [phone]

## 2020-08-15 LAB — CBC WITH DIFFERENTIAL/PLATELET
Basophils Absolute: 0.1 10*3/uL (ref 0.0–0.2)
Basos: 1 %
EOS (ABSOLUTE): 0 10*3/uL (ref 0.0–0.4)
Eos: 1 %
Hematocrit: 42.5 % (ref 34.0–46.6)
Hemoglobin: 13.7 g/dL (ref 11.1–15.9)
Immature Grans (Abs): 0 10*3/uL (ref 0.0–0.1)
Immature Granulocytes: 0 %
Lymphocytes Absolute: 2.1 10*3/uL (ref 0.7–3.1)
Lymphs: 32 %
MCH: 28.8 pg (ref 26.6–33.0)
MCHC: 32.2 g/dL (ref 31.5–35.7)
MCV: 89 fL (ref 79–97)
Monocytes Absolute: 0.7 10*3/uL (ref 0.1–0.9)
Monocytes: 11 %
Neutrophils Absolute: 3.8 10*3/uL (ref 1.4–7.0)
Neutrophils: 55 %
Platelets: 325 10*3/uL (ref 150–450)
RBC: 4.76 x10E6/uL (ref 3.77–5.28)
RDW: 13 % (ref 11.7–15.4)
WBC: 6.8 10*3/uL (ref 3.4–10.8)

## 2020-08-15 LAB — TSH: TSH: 0.451 u[IU]/mL (ref 0.450–4.500)

## 2020-08-21 ENCOUNTER — Other Ambulatory Visit: Payer: Self-pay

## 2020-08-21 MED ORDER — THYROID 120 MG PO TABS: 120.0000 mg | ORAL_TABLET | Freq: Every day | ORAL | 0 refills | Status: DC

## 2020-08-21 NOTE — Telephone Encounter (Signed)
Patient is needing refill of medication but also stated that we had seen her last week for sinusitis and stated that she is still having a lot of sinus pressure and would like for [place] to send prednisone into the pharmacy for her because she states that's normally what helps her with that. [place]

## 2020-08-23 ENCOUNTER — Other Ambulatory Visit: Payer: Self-pay | Admitting: Family Medicine

## 2020-08-23 ENCOUNTER — Telehealth: Payer: Self-pay

## 2020-08-23 DIAGNOSIS — J0181 Other acute recurrent sinusitis: Secondary | ICD-10-CM

## 2020-08-23 MED ORDER — PREDNISONE 50 MG PO TABS: 50.0000 mg | ORAL_TABLET | Freq: Every day | ORAL | 0 refills | Status: DC

## 2020-08-23 MED ORDER — AMOXICILLIN 875 MG PO TABS: 875.0000 mg | ORAL_TABLET | Freq: Two times a day (BID) | ORAL | 0 refills | Status: DC

## 2020-08-23 NOTE — Telephone Encounter (Signed)
Sherry Carpenter called to report that she continues to have sinus pressure and discomfort.  She is requesting Prednisone therapy be sent to the pharmacy.  She will complete the antibiotic today.  She was informed that the prednisone could worsen her periods but she prefers to proceed with the medication.  Dr. Sedalia Muta is going to extend the antibiotics for the patient as well.

## 2020-11-13 DIAGNOSIS — Z23 Encounter for immunization: Secondary | ICD-10-CM | POA: Diagnosis not present

## 2020-11-22 ENCOUNTER — Other Ambulatory Visit: Payer: Self-pay | Admitting: Family Medicine

## 2020-12-26 ENCOUNTER — Ambulatory Visit: Payer: BLUE CROSS/BLUE SHIELD | Admitting: Podiatry

## 2021-01-14 ENCOUNTER — Other Ambulatory Visit: Payer: Self-pay | Admitting: Family Medicine

## 2021-01-15 DIAGNOSIS — N921 Excessive and frequent menstruation with irregular cycle: Secondary | ICD-10-CM | POA: Diagnosis not present

## 2021-01-15 DIAGNOSIS — Z130 Encounter for screening for diseases of the blood and blood-forming organs and certain disorders involving the immune mechanism: Secondary | ICD-10-CM | POA: Diagnosis not present

## 2021-01-15 DIAGNOSIS — Z01419 Encounter for gynecological examination (general) (routine) without abnormal findings: Secondary | ICD-10-CM | POA: Diagnosis not present

## 2021-01-15 DIAGNOSIS — Z1151 Encounter for screening for human papillomavirus (HPV): Secondary | ICD-10-CM | POA: Diagnosis not present

## 2021-02-04 DIAGNOSIS — J0110 Acute frontal sinusitis, unspecified: Secondary | ICD-10-CM | POA: Diagnosis not present

## 2021-02-14 ENCOUNTER — Other Ambulatory Visit: Payer: Self-pay | Admitting: Family Medicine

## 2021-02-19 ENCOUNTER — Encounter: Payer: BLUE CROSS/BLUE SHIELD | Admitting: Family Medicine

## 2021-02-27 DIAGNOSIS — J029 Acute pharyngitis, unspecified: Secondary | ICD-10-CM | POA: Diagnosis not present

## 2021-02-27 DIAGNOSIS — J0110 Acute frontal sinusitis, unspecified: Secondary | ICD-10-CM | POA: Diagnosis not present

## 2021-06-20 DIAGNOSIS — Z0184 Encounter for antibody response examination: Secondary | ICD-10-CM | POA: Diagnosis not present

## 2021-06-20 DIAGNOSIS — Z0000 Encounter for general adult medical examination without abnormal findings: Secondary | ICD-10-CM | POA: Diagnosis not present

## 2021-08-21 DIAGNOSIS — E039 Hypothyroidism, unspecified: Secondary | ICD-10-CM | POA: Diagnosis not present

## 2021-12-02 ENCOUNTER — Other Ambulatory Visit: Payer: Self-pay | Admitting: Family Medicine

## 2021-12-02 DIAGNOSIS — Z1231 Encounter for screening mammogram for malignant neoplasm of breast: Secondary | ICD-10-CM

## 2021-12-03 DIAGNOSIS — E039 Hypothyroidism, unspecified: Secondary | ICD-10-CM | POA: Diagnosis not present

## 2021-12-09 DIAGNOSIS — R221 Localized swelling, mass and lump, neck: Secondary | ICD-10-CM | POA: Diagnosis not present

## 2021-12-09 DIAGNOSIS — M542 Cervicalgia: Secondary | ICD-10-CM | POA: Diagnosis not present

## 2022-01-06 ENCOUNTER — Ambulatory Visit
Admission: RE | Admit: 2022-01-06 | Discharge: 2022-01-06 | Disposition: A | Discharge status: Home / Self Care | Payer: BC Managed Care – PPO | Source: Ambulatory Visit | Attending: Family Medicine | Admitting: Family Medicine

## 2022-01-06 DIAGNOSIS — Z1231 Encounter for screening mammogram for malignant neoplasm of breast: Secondary | ICD-10-CM

## 2022-03-25 ENCOUNTER — Other Ambulatory Visit: Payer: Self-pay | Admitting: Family Medicine

## 2022-03-25 NOTE — Telephone Encounter (Signed)
Refill sent to pharmacy.   

## 2022-06-11 IMAGING — MG MM DIGITAL SCREENING BILAT W/ TOMO AND CAD
8 series · 9 of 24 positions shown · non-contrast
Comparison: Previous exam(s).

CLINICAL DATA: Screening.

EXAM:
DIGITAL SCREENING BILATERAL MAMMOGRAM WITH TOMOSYNTHESIS AND CAD
TECHNIQUE: Bilateral screening digital craniocaudal and mediolateral oblique
mammograms were obtained. Bilateral screening digital breast
tomosynthesis was performed. The images were evaluated with
computer-aided detection.

[L MLO synth-2D]
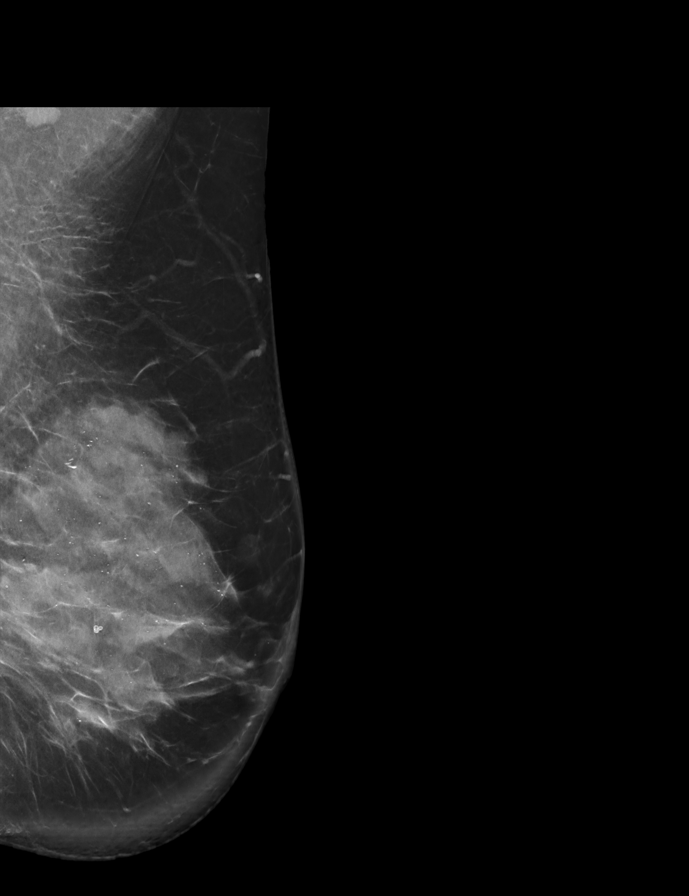

[L CC synth-2D]
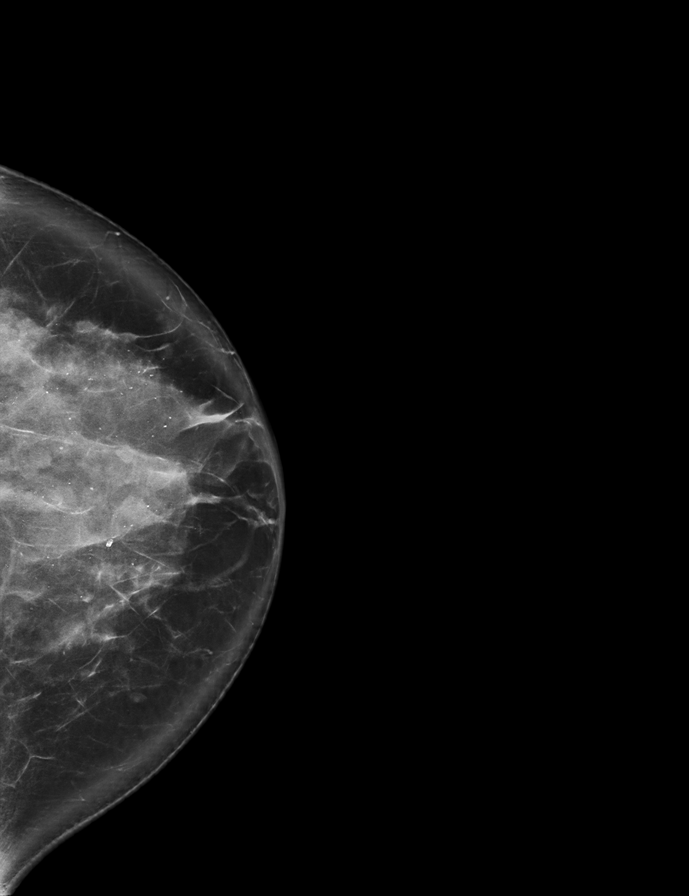

[R MLO synth-2D]
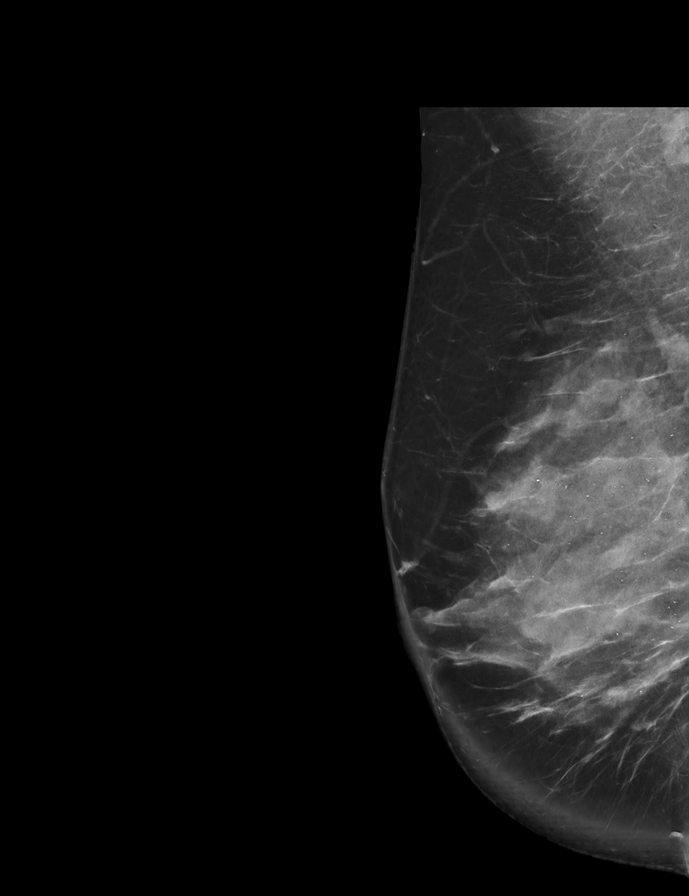

[R CC synth-2D]
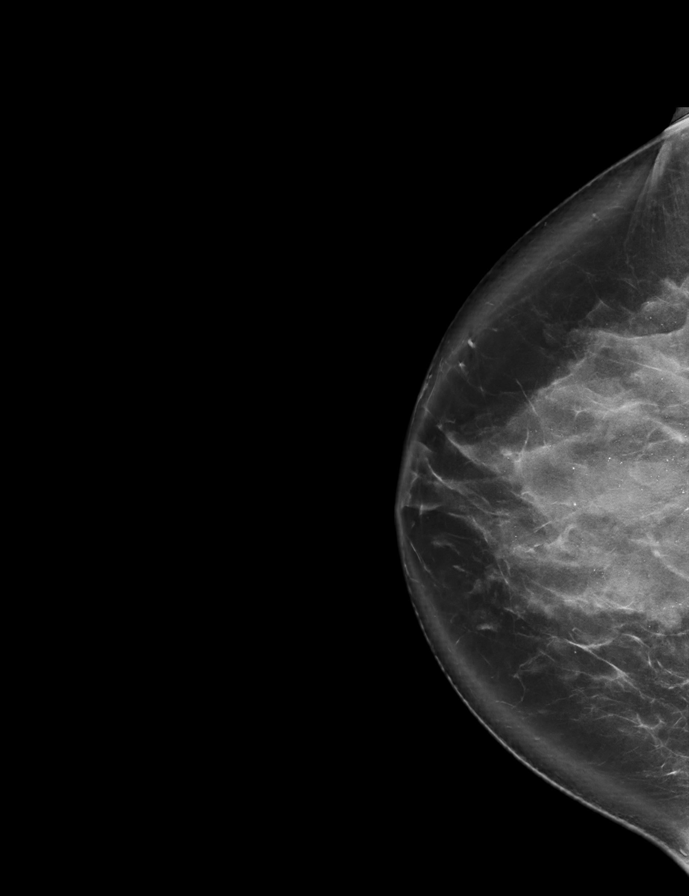

[R MLO tomo · 2 of 97 frames shown]
[frame 32/97]
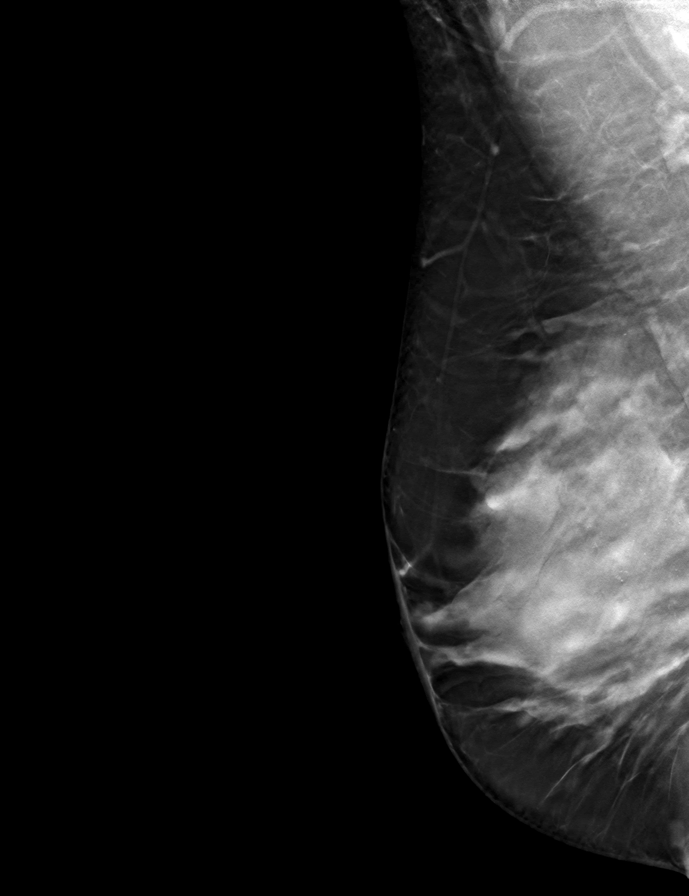
[frame 49/97]
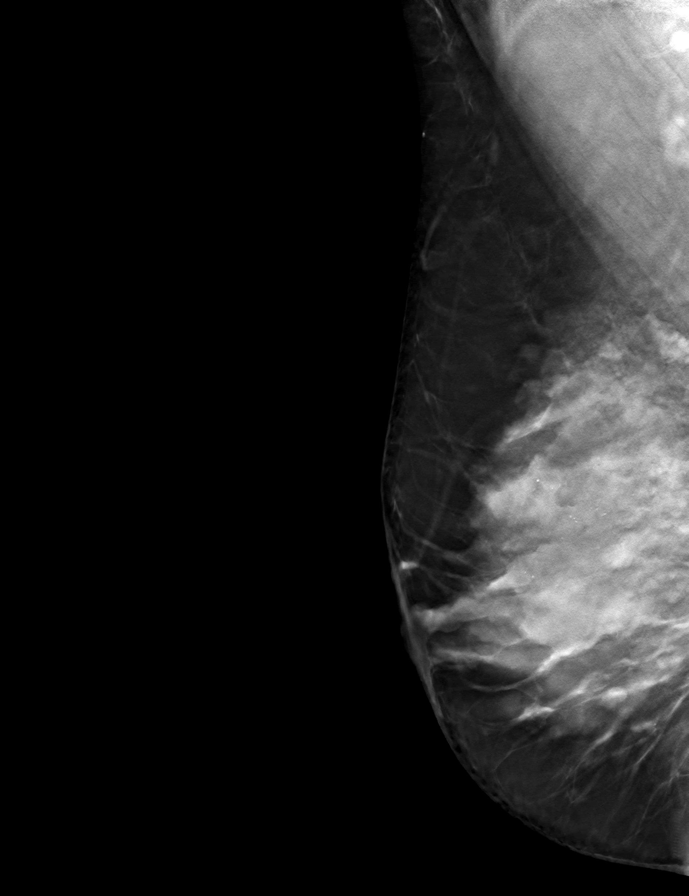

[R CC tomo · tomo slice 47/94.0]
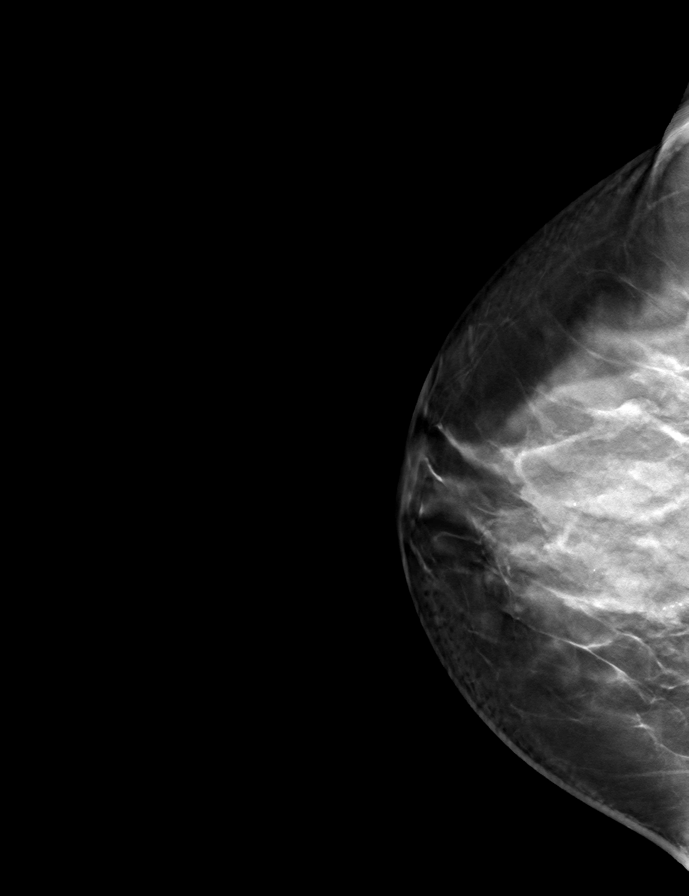

[L MLO tomo · tomo slice 47/93.0]
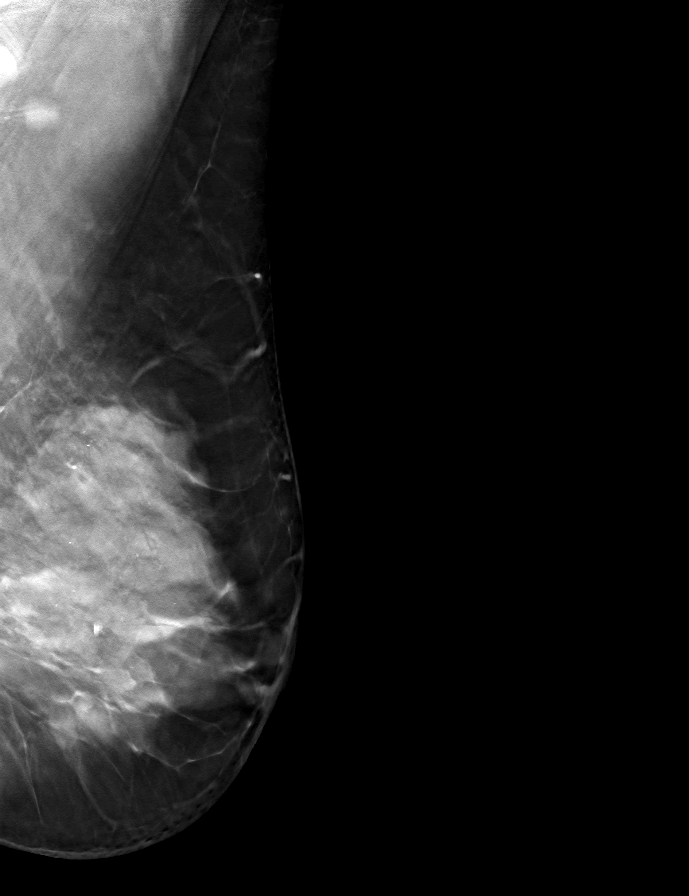

[L CC tomo · tomo slice 45/88.0]
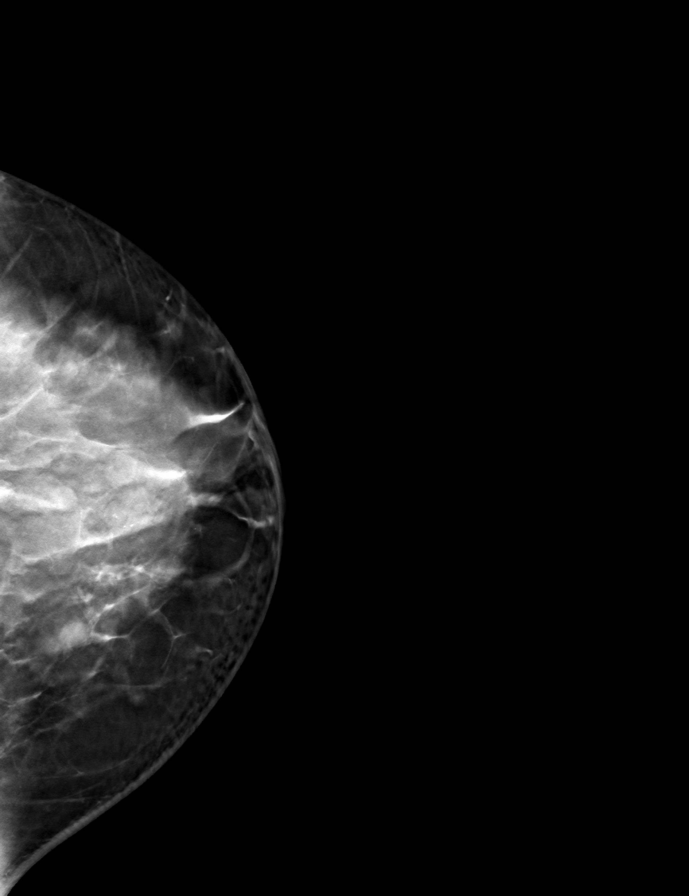

[9 of 24 positions shown; findings below may reference images not displayed]

ACR Breast Density Category d: The breast tissue is extremely dense,
which lowers the sensitivity of mammography
FINDINGS: There are no findings suspicious for malignancy.
IMPRESSION: No mammographic evidence of malignancy. A result letter of this
screening mammogram will be mailed directly to the patient.

RECOMMENDATION:
Screening mammogram in one year. (Code:TA-[id number])

BI-RADS CATEGORY  1: Negative.

## 2022-06-29 ENCOUNTER — Ambulatory Visit (AMBULATORY_SURGERY_CENTER): Payer: BC Managed Care – PPO | Admitting: *Deleted

## 2022-06-29 VITALS — Ht 65.0 in | Wt 170.0 lb

## 2022-06-29 DIAGNOSIS — Z800 Family history of malignant neoplasm of digestive organs: Secondary | ICD-10-CM

## 2022-06-29 MED ORDER — NA SULFATE-K SULFATE-MG SULF 17.5-3.13-1.6 GM/177ML PO SOLN: 1.0000 | Freq: Once | ORAL | 0 refills | Status: AC

## 2022-06-29 NOTE — Progress Notes (Signed)
No egg or soy allergy known to patient  No issues known to pt with past sedation with any surgeries or procedures Patient denies ever being told they had issues or difficulty with intubation  No FH of Malignant Hyperthermia Pt is not on diet pills Pt is not on  home 02  Pt is not on blood thinners  Pt denies issues with constipation  No A fib or A flutter Have any cardiac testing pending--pt denies     NO PA's for preps discussed with pt In PV today  Discussed with pt there will be an out-of-pocket cost for prep and that varies from $0 to 70 +  dollars - pt verbalized understanding  Pt instructed to use Singlecare.com or GoodRx for a price reduction on prep

## 2022-07-06 ENCOUNTER — Encounter: Payer: Self-pay | Admitting: Gastroenterology

## 2022-07-08 ENCOUNTER — Encounter: Payer: Self-pay | Admitting: Gastroenterology

## 2022-07-08 ENCOUNTER — Ambulatory Visit (AMBULATORY_SURGERY_CENTER): Payer: BC Managed Care – PPO | Admitting: Gastroenterology

## 2022-07-08 VITALS — BP 100/58 | HR 60 | Temp 98.6°F | Resp 15 | Ht 65.0 in | Wt 157.0 lb

## 2022-07-08 DIAGNOSIS — Z800 Family history of malignant neoplasm of digestive organs: Secondary | ICD-10-CM

## 2022-07-08 DIAGNOSIS — Z1211 Encounter for screening for malignant neoplasm of colon: Secondary | ICD-10-CM | POA: Diagnosis present

## 2022-07-08 MED ORDER — SODIUM CHLORIDE 0.9 % IV SOLN: 500.0000 mL | INTRAVENOUS | Status: DC

## 2022-07-08 NOTE — Progress Notes (Signed)
To pacu, VSS. Report to [email or web address].tb 

## 2022-07-08 NOTE — Progress Notes (Signed)
Pt's states no medical or surgical changes since previsit or office visit. 

## 2022-07-08 NOTE — Patient Instructions (Signed)
YOU HAD AN ENDOSCOPIC PROCEDURE TODAY AT THE [facility]:   Refer to the procedure report that was given to you for any specific questions about what was found during the examination.  If the procedure report does not answer your questions, please call your gastroenterologist to clarify.  If you requested that your care partner not be given the details of your procedure findings, then the procedure report has been included in a sealed envelope for you to review at your convenience later.  YOU SHOULD EXPECT: Some feelings of bloating in the abdomen. Passage of more gas than usual.  Walking can help get rid of the air that was put into your GI tract during the procedure and reduce the bloating. If you had a lower endoscopy (such as a colonoscopy or flexible sigmoidoscopy) you may notice spotting of blood in your stool or on the toilet paper. If you underwent a bowel prep for your procedure, you may not have a normal bowel movement for a few days.  Please Note:  You might notice some irritation and congestion in your nose or some drainage.  This is from the oxygen used during your procedure.  There is no need for concern and it should clear up in a day or so.  SYMPTOMS TO REPORT IMMEDIATELY:  Following lower endoscopy (colonoscopy or flexible sigmoidoscopy):  Excessive amounts of blood in the stool  Significant tenderness or worsening of abdominal pains  Swelling of the abdomen that is new, acute  Fever of 100F or higher  Following upper endoscopy (EGD)  Vomiting of blood or coffee ground material  New chest pain or pain under the shoulder blades  Painful or persistently difficult swallowing  New shortness of breath  Fever of 100F or higher  Black, tarry-looking stools  For urgent or emergent issues, a gastroenterologist can be reached at any hour by calling [phone]. Do not use MyChart messaging for urgent concerns.    DIET:  We do recommend a small meal at first, but  then you may proceed to your regular diet.  Drink plenty of fluids but you should avoid alcoholic beverages for 24 hours.  ACTIVITY:  You should plan to take it easy for the rest of today and you should NOT DRIVE or use heavy machinery until tomorrow (because of the sedation medicines used during the test).    FOLLOW UP: Our staff will call the number listed on your records the next business day following your procedure.  We will call around 7:15- 8:00 am to check on you and address any questions or concerns that you may have regarding the information given to you following your procedure. If we do not reach you, we will leave a message.  If you develop any symptoms (ie: fever, flu-like symptoms, shortness of breath, cough etc.) before then, please call [phone].  If you test positive for Covid 19 in the 2 weeks post procedure, please call and report this information to us.    If any biopsies were taken you will be contacted by phone or by letter within the next 1-3 weeks.  Please call us at [phone] if you have not heard about the biopsies in 3 weeks.    SIGNATURES/CONFIDENTIALITY: You and/or your care partner have signed paperwork which will be entered into your electronic medical record.  These signatures attest to the fact that that the information above on your After Visit Summary has been reviewed and is understood.  Full responsibility of the confidentiality   of this discharge information lies with you and/or your care-partner.  

## 2022-07-08 NOTE — Progress Notes (Signed)
[facility] History and Physical   Primary Care Physician:  Nonnie Done., MD   Reason for Procedure:   FH mom at age 51  Plan:    Colonoscopy     HPI: Sherry Carpenter is a 51 y.o. female   No nausea, vomiting, heartburn, regurgitation, odynophagia or dysphagia.  No significant diarrhea or constipation.  No melena or hematochezia. No unintentional weight loss. No abdominal pain.  Past Medical History:  Diagnosis Date   Allergy    Anemia    pasyt hx in 20's   GERD (gastroesophageal reflux disease)    Heart murmur    echo- Normal per pt as a child   Postprocedural hypothyroidism    Umbilical hernia    Vitamin D deficiency, unspecified     Past Surgical History:  Procedure Laterality Date   APPENDECTOMY  2012   CHOLECYSTECTOMY  2012   also tried to correct umbilical hernia but surgery failed, still present   COLONOSCOPY     THYROIDECTOMY  2012   TUBAL LIGATION      Prior to Admission medications   Medication Sig Start Date End Date Taking? Authorizing Provider  ARMOUR THYROID 60 MG tablet TAKE TWO TABLETS BY MOUTH EVERY DAY BEFORE BREAKFAST 11/24/20  Yes Cox, Kirsten, MD  ARMOUR THYROID 90 MG tablet Take 90 mg by mouth daily. 02/18/22  Yes [provider]  Ascorbic Acid (VITAMIN C PO) Take by mouth.   Yes [provider]  cholecalciferol (VITAMIN D3) 25 MCG (1000 UNIT) tablet Take 1,000 Units by mouth daily. Vitamin d 3 - Takes several times a week for extra boost   Yes [provider]  diphenhydrAMINE (BENADRYL) 25 mg capsule Take 50 mg by mouth at bedtime as needed.   Yes [provider]  ECHINACEA EXTRACT PO Take by mouth.   Yes [provider]  furosemide (LASIX) 20 MG tablet TAKE 1 TABLET BY MOUTH ONCE DAILY AS NEEDED FOR SWELLING 07/23/20  Yes Cox, Kirsten, MD  loratadine (CLARITIN) 10 MG tablet Take 10 mg by mouth daily.   Yes [provider]  pantoprazole (PROTONIX) 40 MG tablet TAKE ONE  TABLET BY MOUTH EVERY DAY 02/14/21  Yes Cox, Kirsten, MD  Vitamin D, Ergocalciferol, (DRISDOL) 1.25 MG (50000 UNIT) CAPS capsule TAKE 1 CAPSULE ONCE WEEKLY 03/25/22  Yes Cox, Kirsten, MD    Current Outpatient Medications  Medication Sig Dispense Refill   ARMOUR THYROID 60 MG tablet TAKE TWO TABLETS BY MOUTH EVERY DAY BEFORE BREAKFAST 180 tablet 1   ARMOUR THYROID 90 MG tablet Take 90 mg by mouth daily.     Ascorbic Acid (VITAMIN C PO) Take by mouth.     cholecalciferol (VITAMIN D3) 25 MCG (1000 UNIT) tablet Take 1,000 Units by mouth daily. Vitamin d 3 - Takes several times a week for extra boost     diphenhydrAMINE (BENADRYL) 25 mg capsule Take 50 mg by mouth at bedtime as needed.     ECHINACEA EXTRACT PO Take by mouth.     furosemide (LASIX) 20 MG tablet TAKE 1 TABLET BY MOUTH ONCE DAILY AS NEEDED FOR SWELLING 30 tablet 2   loratadine (CLARITIN) 10 MG tablet Take 10 mg by mouth daily.     pantoprazole (PROTONIX) 40 MG tablet TAKE ONE TABLET BY MOUTH EVERY DAY 90 tablet 3   Vitamin D, Ergocalciferol, (DRISDOL) 1.25 MG (50000 UNIT) CAPS capsule TAKE 1 CAPSULE ONCE WEEKLY 12 capsule 3   Current Facility-Administered Medications  Medication Dose  Route Frequency Provider Last Rate Last Admin   0.9 %  sodium chloride infusion  500 mL Intravenous Continuous Lynann Bologna, MD        Allergies as of 07/08/2022   (No Known Allergies)    Family History  Problem Relation Age of Onset   Colon polyps Mother    Stomach cancer Mother    Colon cancer Mother    Lung cancer Mother    Hypothyroidism Mother    Breast cancer Neg Hx    Esophageal cancer Neg Hx    Rectal cancer Neg Hx     Social History   Socioeconomic History   Marital status: Married    Spouse name: Not on file   Number of children: 2   Years of education: Not on file   Highest education level: Not on file  Occupational History   Occupation: Theatre stage manager. Assistant  Tobacco Use   Smoking status: Former    Types:  Cigarettes    Quit date: 12/28/2010    Years since quitting: 11.5   Smokeless tobacco: Never  Vaping Use   Vaping Use: Never used  Substance and Sexual Activity   Alcohol use: Not Currently   Drug use: Never   Sexual activity: Not on file  Other Topics Concern   Not on file  Social History Narrative   wears sunscreen, brushes and flosses daily, see's dentist bi-annually, has smoke detectors, wears a seatbelt and practices gun safety   Social Determinants of Health   Financial Resource Strain: Not on file  Food Insecurity: Not on file  Transportation Needs: Not on file  Physical Activity: Not on file  Stress: Not on file  Social Connections: Not on file  Intimate Partner Violence: Not on file    Review of Systems: Positive for none All other review of systems negative except as mentioned in the HPI.  Physical Exam: Vital signs in last 24 hours:   General:   Alert,  Well-developed, well-nourished, pleasant and cooperative in NAD Lungs:  Clear throughout to auscultation.   Heart:  Regular rate and rhythm; no murmurs, clicks, rubs,  or gallops. Abdomen:  Soft, nontender and nondistended. Normal bowel sounds.   Neuro/Psych:  Alert and cooperative. Normal mood and affect. A and O x 3    No significant changes were identified.  The patient continues to be an appropriate candidate for the planned procedure and anesthesia.   Edman Circle, MD. [facility] 07/08/2022 8:09 AM@

## 2022-07-08 NOTE — Op Note (Signed)
[facility] Patient Name: Sherry Carpenter Procedure Date: 07/08/2022 7:34 AM MRN: [id number] Endoscopist: Lynann Bologna , MD Age: 51 Referring MD:  Date of Birth: January 30, 1971 Gender: Female Account #: [id number] Procedure:                Colonoscopy Indications:              Screening in patient at increased risk: Colorectal                            cancer in mother at age 15 Medicines:                Monitored Anesthesia Care Procedure:                Pre-Anesthesia Assessment:                           - Prior to the procedure, a History and Physical                            was performed, and patient medications and                            allergies were reviewed. The patient's tolerance of                            previous anesthesia was also reviewed. The risks                            and benefits of the procedure and the sedation                            options and risks were discussed with the patient.                            All questions were answered, and informed consent                            was obtained. Prior Anticoagulants: The patient has                            taken no previous anticoagulant or antiplatelet                            agents. ASA Grade Assessment: II - A patient with                            mild systemic disease. After reviewing the risks                            and benefits, the patient was deemed in                            satisfactory condition to undergo the procedure.  After obtaining informed consent, the colonoscope                            was passed under direct vision. Throughout the                            procedure, the patient's blood pressure, pulse, and                            oxygen saturations were monitored continuously. The                            PCF-HQ190L Colonoscope was introduced through the                            anus and advanced to the 2 cm into  the ileum. The                            colonoscopy was performed without difficulty. The                            patient tolerated the procedure well. The quality                            of the bowel preparation was good. The terminal                            ileum, ileocecal valve, appendiceal orifice, and                            rectum were photographed. Scope In: 8:13:11 AM Scope Out: 8:26:11 AM Scope Withdrawal Time: 0 hours 7 minutes 54 seconds  Total Procedure Duration: 0 hours 13 minutes 0 seconds  Findings:                 Multiple medium-mouthed diverticula were found in                            the sigmoid colon, few in descending colon and                            ascending colon. It would give sigmoid colon a                            "Swiss cheese appearance". There was luminal                            narrowing consistent with muscular hypertrophy. Few                            diverticuli with stool impacted. No endoscopic                            evidence of diverticulitis.  Non-bleeding internal hemorrhoids were found during                            retroflexion. The hemorrhoids were small and Grade                            I (internal hemorrhoids that do not prolapse).                           The terminal ileum appeared normal.                           The exam was otherwise without abnormality on                            direct and retroflexion views. Complications:            No immediate complications. Estimated Blood Loss:     Estimated blood loss: none. Impression:               - Pancolonic diverticulosis predominantly in the                            sigmoid colon.                           - Non-bleeding internal hemorrhoids.                           - The examined portion of the ileum was normal.                           - The examination was otherwise normal on direct                            and  retroflexion views.                           - No specimens collected. Recommendation:           - Patient has a contact number available for                            emergencies. The signs and symptoms of potential                            delayed complications were discussed with the                            patient. Return to normal activities tomorrow.                            Written discharge instructions were provided to the                            patient.                           -  High fiber diet. If hard stools, start Benefiber                            1 tablespoon p.o. once a day with 8 ounces of water.                           - Continue present medications.                           - Repeat colonoscopy in 5 years for screening                            purposes. Earlier, if with any new problems or                            change in family history.                           - The findings and recommendations were discussed                            with the patient's family. Lynann Bologna, MD 07/08/2022 8:32:01 AM This report has been signed electronically.

## 2022-07-09 ENCOUNTER — Telehealth: Payer: Self-pay

## 2022-07-09 NOTE — Telephone Encounter (Signed)
  Follow up Call-     07/08/2022    7:26 AM  Call back number  Post procedure Call Back phone  # [phone]  Permission to leave phone message Yes     Patient questions:  Do you have a fever, pain , or abdominal swelling? No. Pain Score  0 *  Have you tolerated food without any problems? Yes.    Have you been able to return to your normal activities? Yes.    Do you have any questions about your discharge instructions: Diet   No. Medications  No. Follow up visit  No.  Do you have questions or concerns about your Care? No.  Actions: * If pain score is 4 or above: No action needed, pain <4.

## 2022-08-14 ENCOUNTER — Ambulatory Visit (INDEPENDENT_AMBULATORY_CARE_PROVIDER_SITE_OTHER): Payer: BC Managed Care – PPO

## 2022-08-14 ENCOUNTER — Encounter: Payer: Self-pay | Admitting: Podiatrist

## 2022-08-14 ENCOUNTER — Ambulatory Visit (INDEPENDENT_AMBULATORY_CARE_PROVIDER_SITE_OTHER): Payer: BC Managed Care – PPO | Admitting: Podiatrist

## 2022-08-14 DIAGNOSIS — M7752 Other enthesopathy of left foot: Secondary | ICD-10-CM

## 2022-08-14 DIAGNOSIS — M7750 Other enthesopathy of unspecified foot: Secondary | ICD-10-CM

## 2022-08-14 DIAGNOSIS — M7751 Other enthesopathy of right foot: Secondary | ICD-10-CM | POA: Diagnosis not present

## 2022-08-14 DIAGNOSIS — M722 Plantar fascial fibromatosis: Secondary | ICD-10-CM

## 2022-08-14 MED ORDER — TRIAMCINOLONE ACETONIDE 10 MG/ML IJ SUSP
10.0000 mg | Freq: Once | INTRAMUSCULAR | Status: AC
  Administered 2022-08-14: 10 mg

## 2022-08-14 NOTE — Patient Instructions (Signed)

## 2022-08-14 NOTE — Progress Notes (Unsigned)
Subjective: Sherry Carpenter is a 51 y.o. female patient presents to office with complaint of moderate heel pain on the {right heel. Patient admits to post static dyskinesia for 4 months in duration. Patient has treated this problem with night splints, stretches, shoe changes with minimal relief. Denies any other pedal complaints.   Patient Active Problem List   Diagnosis Date Noted   Visit for screening mammogram 02/07/2020   Ventral hernia without obstruction or gangrene 02/07/2020   Secondary hypothyroidism 02/07/2020   Vitamin D insufficiency 02/07/2020    Current Outpatient Medications on File Prior to Visit  Medication Sig Dispense Refill   ARMOUR THYROID 60 MG tablet TAKE TWO TABLETS BY MOUTH EVERY DAY BEFORE BREAKFAST 180 tablet 1   ARMOUR THYROID 90 MG tablet Take 90 mg by mouth daily.     Ascorbic Acid (VITAMIN C PO) Take by mouth.     cholecalciferol (VITAMIN D3) 25 MCG (1000 UNIT) tablet Take 1,000 Units by mouth daily. Vitamin d 3 - Takes several times a week for extra boost     diphenhydrAMINE (BENADRYL) 25 mg capsule Take 50 mg by mouth at bedtime as needed.     ECHINACEA EXTRACT PO Take by mouth.     furosemide (LASIX) 20 MG tablet TAKE 1 TABLET BY MOUTH ONCE DAILY AS NEEDED FOR SWELLING 30 tablet 2   loratadine (CLARITIN) 10 MG tablet Take 10 mg by mouth daily.     pantoprazole (PROTONIX) 40 MG tablet TAKE ONE TABLET BY MOUTH EVERY DAY 90 tablet 3   Vitamin D, Ergocalciferol, (DRISDOL) 1.25 MG (50000 UNIT) CAPS capsule TAKE 1 CAPSULE ONCE WEEKLY 12 capsule 3   No current facility-administered medications on file prior to visit.    No Known Allergies  Objective: Physical Exam General: The patient is alert and oriented x3 in no acute distress.  Dermatology: Skin is warm, dry and supple bilateral lower extremities. Nails 1-10 are normal. There is no erythema, edema, no eccymosis, no open lesions present. Integument is otherwise unremarkable.  Vascular: Dorsalis Pedis  pulse and Posterior Tibial pulse are 2/4 bilateral. Capillary fill time is immediate to all digits.  Neurological: Grossly intact to light touch with an achilles reflex of +2/5 and a  negative Tinel's sign bilateral.  Musculoskeletal: Tenderness to palpation at the medial calcaneal tubercale and through the insertion of the plantar fascia on the Rightfoot. No pain with compression of calcaneus bilateral. No pain with tuning fork to calcaneus bilateral. No pain with calf compression bilateral. There is decreased Ankle joint range of motion bilateral. All other joints range of motion within normal limits bilateral. Strength 5/5 in all groups bilateral.   Gait: Unassisted, Antalgic avoid weight on left heel  Xray, rightfoot:  Normal osseous mineralization. Joint spaces preserved. No fracture/dislocation/boney destruction. No spur present with mild thickening of plantar fascia. No other soft tissue abnormalities or radiopaque foreign bodies.   Assessment and Plan: Problem List Items Addressed This Visit   None Visit Diagnoses     Tendonitis of ankle or foot    -  Primary   Relevant Orders   DG Foot Complete Left   DG Foot Complete Right       -Complete examination performed.  -Xrays reviewed -Discussed with patient in detail the condition of plantar fasciitis, how this occurs and general treatment options. Explained both conservative and surgical treatments.  -After oral consent and aseptic prep, injected a mixture containing 10mg  Kenalog and 5mg  marcaine plain was infiltrated into the area  of maximal tenderness of the Right heel. Post-injection care discussed with patient.  -Rx Meloxicam/Diclofenac/Lodine to start for 2 weeks and taper was advised after 2 weeks.  -Recommended good supportive shoes and advised use of OTC insert. Explained to patient that if these orthoses work well, we will continue with these. If these do not improve his/her condition and  pain, we will consider custom  molded orthoses. - Explained in detail the use of the fascial brace/ night splint which was dispensed at today's visit. -Explained and dispensed to patient daily stretching exercises.. -Patient to return to office in 4 weeks if symptoms worsen or fail to improve.

## 2022-09-16 ENCOUNTER — Ambulatory Visit (INDEPENDENT_AMBULATORY_CARE_PROVIDER_SITE_OTHER): Payer: BC Managed Care – PPO | Admitting: Podiatry

## 2022-09-16 ENCOUNTER — Encounter: Payer: Self-pay | Admitting: Podiatry

## 2022-09-16 DIAGNOSIS — M722 Plantar fascial fibromatosis: Secondary | ICD-10-CM | POA: Diagnosis not present

## 2022-09-16 MED ORDER — DEXAMETHASONE SODIUM PHOSPHATE 120 MG/30ML IJ SOLN
4.0000 mg | Freq: Once | INTRAMUSCULAR | Status: AC
  Administered 2022-09-16: 4 mg via INTRA_ARTICULAR

## 2022-09-16 NOTE — Progress Notes (Signed)
Subjective: Sherry Carpenter is a 51 y.o. female patient presents to office for follow-up of right plantar fasciitis. Relates it is doing much better and more than 50% better Does still have some pain on the outside of her heel. Has been stretching and wearing support. Has not gone back to her walking yet. Denies any other pedal complaints.   Patient Active Problem List   Diagnosis Date Noted   Visit for screening mammogram 02/07/2020   Ventral hernia without obstruction or gangrene 02/07/2020   Secondary hypothyroidism 02/07/2020   Vitamin D insufficiency 02/07/2020    Current Outpatient Medications on File Prior to Visit  Medication Sig Dispense Refill   ARMOUR THYROID 60 MG tablet TAKE TWO TABLETS BY MOUTH EVERY DAY BEFORE BREAKFAST 180 tablet 1   ARMOUR THYROID 90 MG tablet Take 90 mg by mouth daily.     Ascorbic Acid (VITAMIN C PO) Take by mouth.     cholecalciferol (VITAMIN D3) 25 MCG (1000 UNIT) tablet Take 1,000 Units by mouth daily. Vitamin d 3 - Takes several times a week for extra boost     diphenhydrAMINE (BENADRYL) 25 mg capsule Take 50 mg by mouth at bedtime as needed.     ECHINACEA EXTRACT PO Take by mouth.     furosemide (LASIX) 20 MG tablet TAKE 1 TABLET BY MOUTH ONCE DAILY AS NEEDED FOR SWELLING 30 tablet 2   loratadine (CLARITIN) 10 MG tablet Take 10 mg by mouth daily.     pantoprazole (PROTONIX) 40 MG tablet TAKE ONE TABLET BY MOUTH EVERY DAY 90 tablet 3   Vitamin D, Ergocalciferol, (DRISDOL) 1.25 MG (50000 UNIT) CAPS capsule TAKE 1 CAPSULE ONCE WEEKLY 12 capsule 3   No current facility-administered medications on file prior to visit.    No Known Allergies  Objective: Physical Exam General: The patient is alert and oriented x3 in no acute distress.  Dermatology: Skin is warm, dry and supple bilateral lower extremities. Nails 1-10 are normal. There is no erythema, edema, no eccymosis, no open lesions present. Integument is otherwise unremarkable.  Vascular:  Dorsalis Pedis pulse and Posterior Tibial pulse are 2/4 bilateral. Capillary fill time is immediate to all digits.  Neurological: Grossly intact to light touch with an achilles reflex of +2/5 and a  negative Tinel's sign bilateral.  Musculoskeletal: Tenderness to palpation at the medial calcaneal tubercale and through the insertion of the plantar fascia on the Right foot. No pain with compression of calcaneus bilateral. No pain with tuning fork to calcaneus bilateral. No pain with calf compression bilateral. There is decreased Ankle joint range of motion bilateral. All other joints range of motion within normal limits bilateral. Strength 5/5 in all groups bilateral.   Gait: Unassisted, Antalgic avoid weight on left heel  Xray, rightfoot:  Normal osseous mineralization. Joint spaces preserved. No fracture/dislocation/boney destruction. No spur present with mild thickening of plantar fascia. Minor increased 4/5 IM anglr bilateral.  Decreased first mpj joint space noted bilateral. No other soft tissue abnormalities or radiopaque foreign bodies.   Assessment and Plan:   ICD-10-CM   1. Plantar fasciitis of right foot  M72.2 dexamethasone (DECADRON) injection 4 mg         -Complete examination performed.  -Xrays reviewed -Discussed with patient in detail the condition of plantar fasciitis, how this occurs and general treatment options.  -After oral consent and aseptic prep, injected a mixture containing 1cc of dexamethasone 5mg  marcaine plain was infiltrated into the area of maximal tenderness of the Right  heel. Post-injection care discussed with patient.  -Recommended good supportive shoes and advised use of OTC insert. Continue with good support.  -Continue with stretching. No restrictions on activity.  - Recommended continued use of the night splint at home.  -Explained and dispensed to patient daily stretching exercises.. -Patient to return to office as needed

## 2023-02-16 ENCOUNTER — Other Ambulatory Visit: Payer: Self-pay | Admitting: Family Medicine

## 2023-02-16 DIAGNOSIS — Z1231 Encounter for screening mammogram for malignant neoplasm of breast: Secondary | ICD-10-CM

## 2023-04-01 ENCOUNTER — Ambulatory Visit
Admission: RE | Admit: 2023-04-01 | Discharge: 2023-04-01 | Disposition: A | Discharge status: Home / Self Care | Payer: BC Managed Care – PPO | Source: Ambulatory Visit | Attending: Family Medicine | Admitting: Family Medicine

## 2023-04-01 DIAGNOSIS — Z1231 Encounter for screening mammogram for malignant neoplasm of breast: Secondary | ICD-10-CM

## 2024-04-07 ENCOUNTER — Emergency Department (HOSPITAL_BASED_OUTPATIENT_CLINIC_OR_DEPARTMENT_OTHER)

## 2024-04-07 ENCOUNTER — Encounter (HOSPITAL_BASED_OUTPATIENT_CLINIC_OR_DEPARTMENT_OTHER): Payer: Self-pay | Admitting: Emergency Medicine

## 2024-04-07 ENCOUNTER — Emergency Department (HOSPITAL_BASED_OUTPATIENT_CLINIC_OR_DEPARTMENT_OTHER)
Admission: EM | Admit: 2024-04-07 | Discharge: 2024-04-07 | Disposition: A | Discharge status: Home / Self Care | Attending: Emergency Medicine | Admitting: Emergency Medicine

## 2024-04-07 ENCOUNTER — Other Ambulatory Visit: Payer: Self-pay

## 2024-04-07 ENCOUNTER — Other Ambulatory Visit (HOSPITAL_BASED_OUTPATIENT_CLINIC_OR_DEPARTMENT_OTHER): Payer: Self-pay

## 2024-04-07 DIAGNOSIS — R29810 Facial weakness: Secondary | ICD-10-CM | POA: Diagnosis present

## 2024-04-07 DIAGNOSIS — G510 Bell's palsy: Secondary | ICD-10-CM | POA: Insufficient documentation

## 2024-04-07 MED ORDER — PREDNISONE 20 MG PO TABS
40.0000 mg | ORAL_TABLET | Freq: Every day | ORAL | 0 refills | Status: AC
  Filled 2024-04-07: qty 10, 5d supply, fill #0

## 2024-04-07 MED ORDER — VALACYCLOVIR HCL 1 G PO TABS
1000.0000 mg | ORAL_TABLET | Freq: Three times a day (TID) | ORAL | 0 refills | Status: AC
  Filled 2024-04-07: qty 21, 7d supply, fill #0

## 2024-04-07 NOTE — ED Notes (Signed)
Urine specimen in lab 

## 2024-04-07 NOTE — ED Triage Notes (Signed)
 Pt reports left facial numbness and tongue tingling started  2 days ago , was seen at [facility] today sent here for further evaluation , no weakness to extremities , ambulatory to treatment room , alert and oriented x 4 .

## 2024-04-07 NOTE — ED Provider Notes (Signed)
 [facility] Provider Note   CSN: [id number] Arrival date & time: 04/07/24  0920     History  Chief Complaint  Patient presents with   Facial Droop    Sherry Carpenter is a 53 y.o. female.  Patient is a 53 year old female with a history of anemia, prior thyroid nodules which were benign resulting in a thyroidectomy who is presenting today with left-sided facial droop and facial numbness.  Patient reports that on Wednesday she started noticing some tingling in her tongue and even bit her tongue on Wednesday but then yesterday started noticing a drooping in her face but then also feeling numb on the right side of her face.  She has not had any symptoms in her arms or legs.  No difficulty with speech or swallowing except that sometimes food or liquid will fall out of her mouth.  She denies any visual changes.  No headaches.  No difficulty walking.  No recent surgeries or hospitalizations.  Patient has not had any new illnesses or medication changes.  The history is provided by the patient, the spouse and medical records.       Home Medications Prior to Admission medications   Medication Sig Start Date End Date Taking? Authorizing Provider  predniSONE (DELTASONE) 20 MG tablet Take 2 tablets (40 mg total) by mouth daily. 04/07/24  Yes Gwyneth Sprout, MD  valACYclovir (VALTREX) 1000 MG tablet Take 1 tablet (1,000 mg total) by mouth 3 (three) times daily. 04/07/24  Yes Gwyneth Sprout, MD  ARMOUR THYROID 60 MG tablet TAKE TWO TABLETS BY MOUTH EVERY DAY BEFORE BREAKFAST 11/24/20   CoxFritzi Mandes, MD  ARMOUR THYROID 90 MG tablet Take 90 mg by mouth daily. 02/18/22   [provider]  Ascorbic Acid (VITAMIN C PO) Take by mouth.    [provider]  cholecalciferol (VITAMIN D3) 25 MCG (1000 UNIT) tablet Take 1,000 Units by mouth daily. Vitamin d 3 - Takes several times a week for extra boost    [provider]   diphenhydrAMINE (BENADRYL) 25 mg capsule Take 50 mg by mouth at bedtime as needed.    [provider]  ECHINACEA EXTRACT PO Take by mouth.    [provider]  furosemide (LASIX) 20 MG tablet TAKE 1 TABLET BY MOUTH ONCE DAILY AS NEEDED FOR SWELLING 07/23/20   Cox, Kirsten, MD  loratadine (CLARITIN) 10 MG tablet Take 10 mg by mouth daily.    [provider]  pantoprazole (PROTONIX) 40 MG tablet TAKE ONE TABLET BY MOUTH EVERY DAY 02/14/21   Cox, Kirsten, MD  Vitamin D, Ergocalciferol, (DRISDOL) 1.25 MG (50000 UNIT) CAPS capsule TAKE 1 CAPSULE ONCE WEEKLY 03/25/22   Blane Ohara, MD      Allergies    Patient has no known allergies.    Review of Systems   Review of Systems  Physical Exam Updated Vital Signs BP (!) 145/78   Pulse 69   Temp 98.7 F (37.1 C)   Resp 17   Wt 74.8 kg   LMP 03/14/2024   SpO2 100%   BMI 27.46 kg/m  Physical Exam Vitals and nursing note reviewed.  Constitutional:      General: She is not in acute distress.    Appearance: She is well-developed.  HENT:     Head: Normocephalic and atraumatic.     Right Ear: Tympanic membrane normal.     Left Ear: Tympanic membrane normal.  Eyes:     General:  No visual field deficit.    Extraocular Movements: Extraocular movements intact.     Pupils: Pupils are equal, round, and reactive to light.     Comments: Left eye is constantly tearing  Cardiovascular:     Rate and Rhythm: Normal rate and regular rhythm.     Heart sounds: Normal heart sounds. No murmur heard.    No friction rub.  Pulmonary:     Effort: Pulmonary effort is normal.     Breath sounds: Normal breath sounds. No wheezing or rales.  Musculoskeletal:        General: No tenderness. Normal range of motion.     Comments: No edema  Skin:    General: Skin is warm and dry.     Findings: No rash.  Neurological:     Mental Status: She is alert and oriented to person, place, and time.     Cranial Nerves: Cranial nerve deficit and  facial asymmetry present. No dysarthria.     Sensory: Sensation is intact.     Motor: Motor function is intact. No weakness or pronator drift.     Coordination: Coordination is intact. Finger-Nose-Finger Test and Heel to Binford Test normal.     Gait: Gait is intact.     Comments: Left-sided facial droop including the forehead.  Unable to blow air into the left side of her cheek and mouth droop on the left.  Intact and dull touch present bilaterally and equal  Psychiatric:        Behavior: Behavior normal.     ED Results / Procedures / Treatments   Labs (all labs ordered are listed, but only abnormal results are displayed) Labs Reviewed - No data to display  EKG None  Radiology CT Head Wo Contrast Result Date: 04/07/2024 CLINICAL DATA:  Neuro deficit with acute stroke suspected EXAM: CT HEAD WITHOUT CONTRAST TECHNIQUE: Contiguous axial images were obtained from the base of the skull through the vertex without intravenous contrast. RADIATION DOSE REDUCTION: This exam was performed according to the departmental dose-optimization program which includes automated exposure control, adjustment of the mA and/or kV according to patient size and/or use of iterative reconstruction technique. COMPARISON:  None Available. FINDINGS: Brain: No evidence of acute infarction, hemorrhage, hydrocephalus, extra-axial collection or mass lesion/mass effect. Vascular: No hyperdense vessel or unexpected calcification. Skull: Normal. Negative for fracture or focal lesion. Sinuses/Orbits: No acute finding. IMPRESSION: Unremarkable head CT. Electronically Signed   By: Tiburcio Pea M.D.   On: 04/07/2024 10:25    Procedures Procedures    Medications Ordered in ED Medications - No data to display  ED Course/ Medical Decision Making/ A&P                                 Medical Decision Making Amount and/or Complexity of Data Reviewed Radiology: ordered and independent interpretation performed. Decision-making  details documented in ED Course.  Risk Prescription drug management.   Patient presenting today with symptoms most classic for Bell's palsy on the left.  She does not completely close her eyelid with blinking but does seem to affect the forehead as well.  She did report some tingling noted on the right side of her face however was sharp and dull testing it is normal bilaterally.  No involvement of the arms or legs.  Cerebellar exam is normal.  11:01 AM I have independently visualized and interpreted pt's images today.  CT of the head is  negative for acute findings.  Radiology reports no acute abnormalities.  Findings discussed with the patient.  At this time she is treated for Bell's palsy and given information about eye protection.  She has follow-up with her PCP on Monday.  Given Valtrex and prednisone.         Final Clinical Impression(s) / ED Diagnoses Final diagnoses:  Bell palsy    Rx / DC Orders ED Discharge Orders          Ordered    valACYclovir (VALTREX) 1000 MG tablet  3 times daily        04/07/24 1100    predniSONE (DELTASONE) 20 MG tablet  Daily        04/07/24 1100              Gwyneth Sprout, MD 04/07/24 1101

## 2024-04-07 NOTE — Discharge Instructions (Signed)
 You will take the antiviral and steroids for the Bell's palsy.  Also get a lubricated eye ointment that you can put on at night before you go to sleep and an eye patch to avoid scratching your eye.  Also use artificial tears during the day to keep the left eye moist.

## 2024-04-07 NOTE — ED Notes (Signed)
 Patient transported to CT

## 2024-05-10 ENCOUNTER — Other Ambulatory Visit: Payer: Self-pay | Admitting: Family Medicine

## 2024-05-10 DIAGNOSIS — Z1231 Encounter for screening mammogram for malignant neoplasm of breast: Secondary | ICD-10-CM

## 2024-05-25 ENCOUNTER — Ambulatory Visit
Admission: RE | Admit: 2024-05-25 | Discharge: 2024-05-25 | Disposition: A | Discharge status: Home / Self Care | Source: Ambulatory Visit | Attending: Family Medicine | Admitting: Family Medicine

## 2024-05-25 DIAGNOSIS — Z1231 Encounter for screening mammogram for malignant neoplasm of breast: Secondary | ICD-10-CM
